# Patient Record
Sex: Male | Born: 1955 | Race: Black or African American | Hispanic: No | Marital: Married | Smoking: Current every day smoker
Health system: Southern US, Community
[De-identification: ages and names within clinical notes are randomized; demographics above are authoritative.]

## PROBLEM LIST (undated history)

## (undated) DIAGNOSIS — I10 Essential (primary) hypertension: Secondary | ICD-10-CM

## (undated) DIAGNOSIS — E119 Type 2 diabetes mellitus without complications: Secondary | ICD-10-CM

## (undated) DIAGNOSIS — K219 Gastro-esophageal reflux disease without esophagitis: Secondary | ICD-10-CM

## (undated) DIAGNOSIS — N289 Disorder of kidney and ureter, unspecified: Secondary | ICD-10-CM

---

## 2001-08-11 ENCOUNTER — Encounter: Payer: Self-pay | Admitting: Emergency Medicine

## 2001-08-11 ENCOUNTER — Emergency Department (HOSPITAL_COMMUNITY): Admission: EM | Admit: 2001-08-11 | Discharge: 2001-08-11 | Payer: Self-pay | Admitting: Emergency Medicine

## 2001-09-15 ENCOUNTER — Emergency Department (HOSPITAL_COMMUNITY): Admission: EM | Admit: 2001-09-15 | Discharge: 2001-09-15 | Payer: Self-pay | Admitting: Emergency Medicine

## 2001-09-15 ENCOUNTER — Encounter: Payer: Self-pay | Admitting: Emergency Medicine

## 2001-10-23 ENCOUNTER — Emergency Department (HOSPITAL_COMMUNITY): Admission: EM | Admit: 2001-10-23 | Discharge: 2001-10-24 | Payer: Self-pay | Admitting: Emergency Medicine

## 2001-11-14 ENCOUNTER — Ambulatory Visit (HOSPITAL_COMMUNITY): Admission: RE | Admit: 2001-11-14 | Discharge: 2001-11-14 | Payer: Self-pay | Admitting: Gastroenterology

## 2003-12-05 ENCOUNTER — Emergency Department (HOSPITAL_COMMUNITY): Admission: EM | Admit: 2003-12-05 | Discharge: 2003-12-05 | Payer: Self-pay | Admitting: Emergency Medicine

## 2004-05-26 ENCOUNTER — Emergency Department (HOSPITAL_COMMUNITY): Admission: EM | Admit: 2004-05-26 | Discharge: 2004-05-27 | Payer: Self-pay | Admitting: Emergency Medicine

## 2005-09-21 IMAGING — CT CT PELVIS W/O CM
1 series · 15 of 32 positions shown, 19 images · non-contrast
Comparison: 12/05/03.

CLINICAL DATA: Right-sided abdominal and pelvic pain and flank pain.  Evaluate for urinary calculi or hydronephrosis.
TECHNIQUE: Multidetector CT imaging of the abdomen and pelvis was performed following the renal stone protocol.  No oral or intravenous contrast was administered.

[Series 3: stone_wo 5.0 b40f st · axial · 0.59mm/px · z∈[+1112,+1400]mm · 15 of 81 slices shown, 19 images]
[im 6/81  soft-tissue]
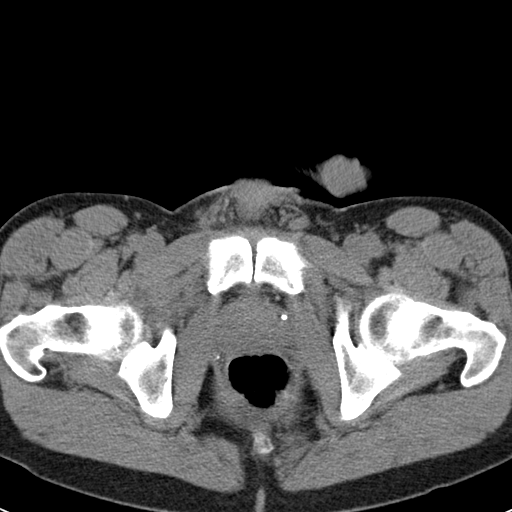
[im 6/81  bone]
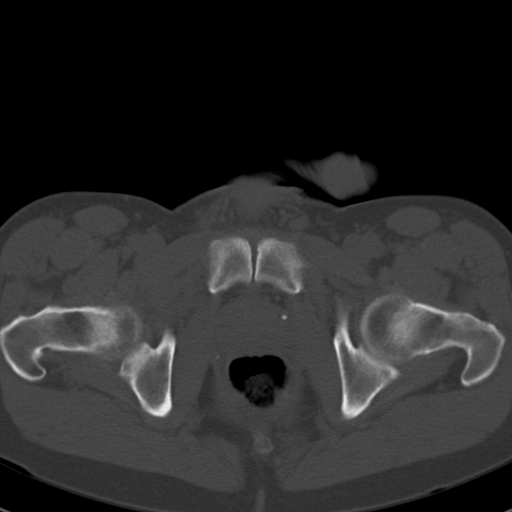
[im 11/81  soft-tissue]
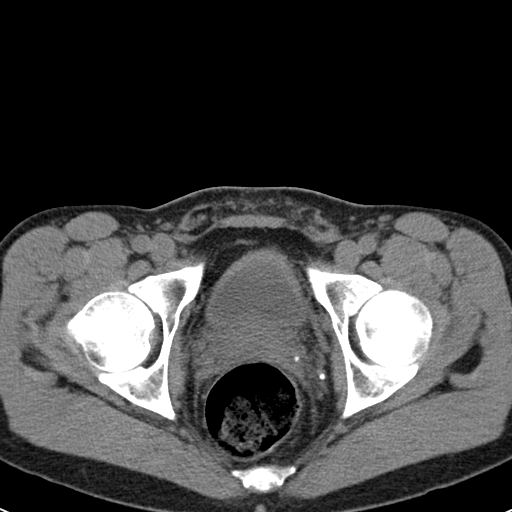
[im 16/81  soft-tissue]
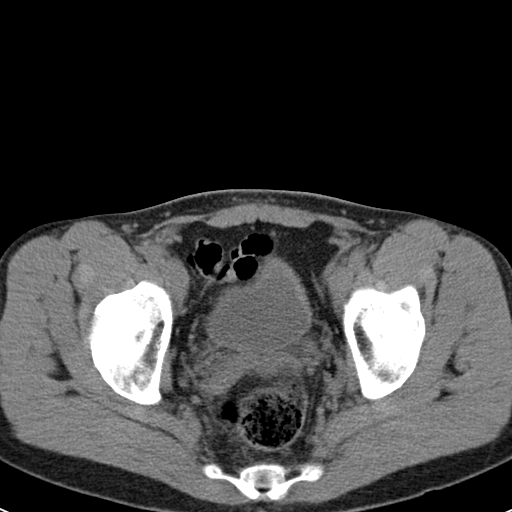
[im 24/81  soft-tissue]
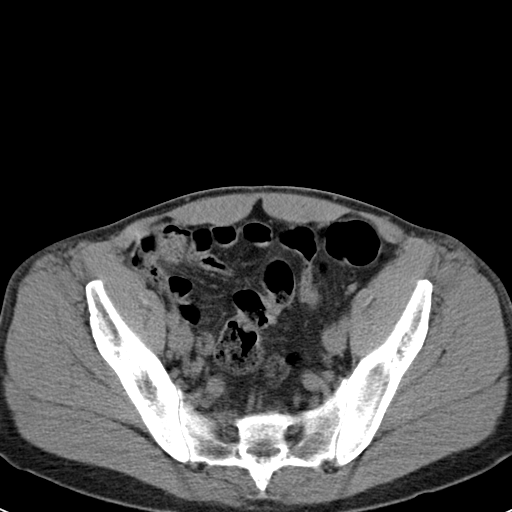
[im 29/81  soft-tissue]
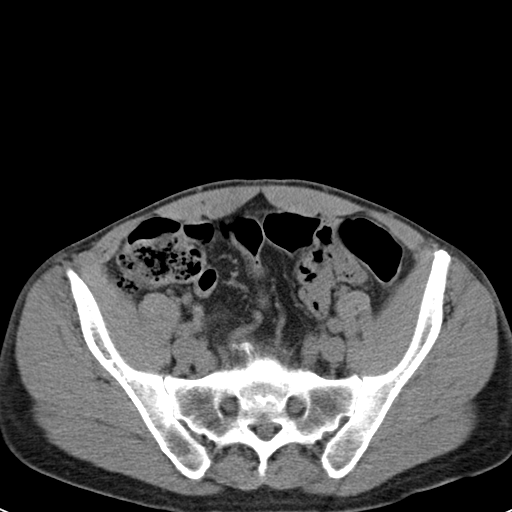
[im 34/81  soft-tissue]
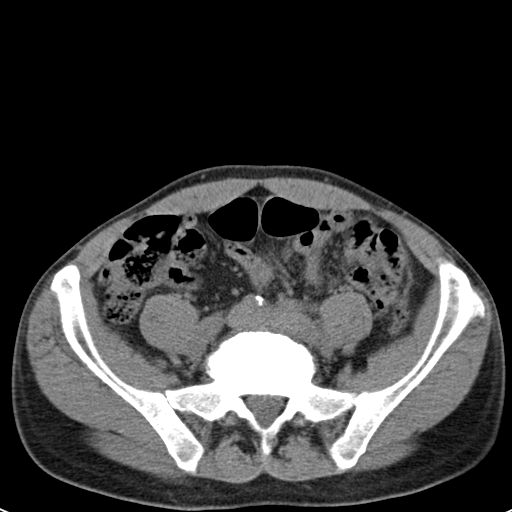
[im 42/81  soft-tissue]
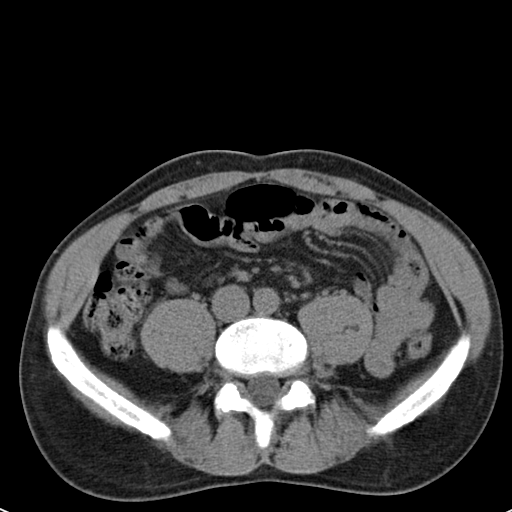
[im 47/81  soft-tissue]
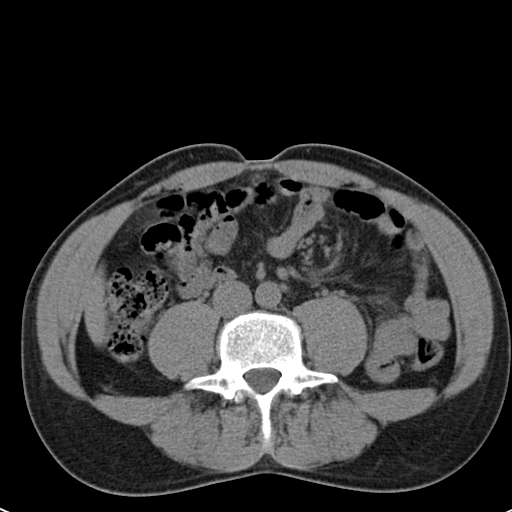
[im 52/81  soft-tissue]
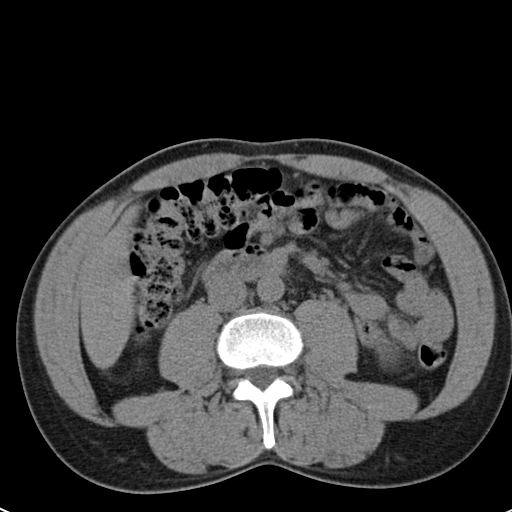
[im 52/81  bone]
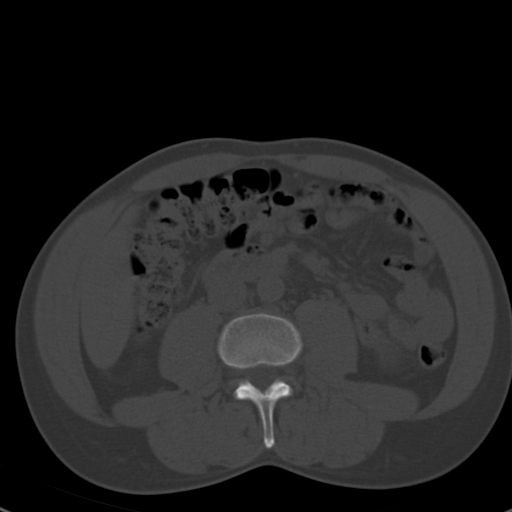
[im 57/81  soft-tissue]
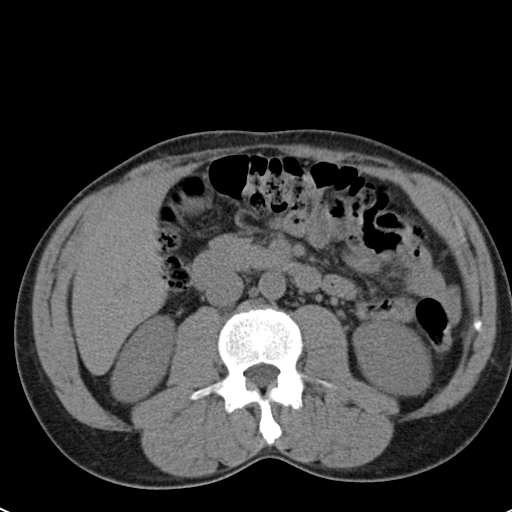
[im 65/81  soft-tissue]
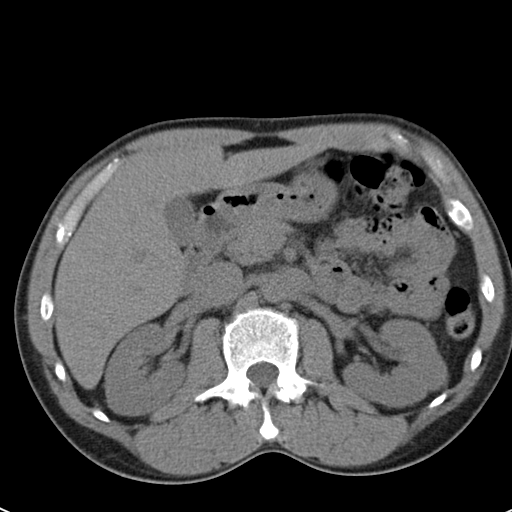
[im 70/81  soft-tissue]
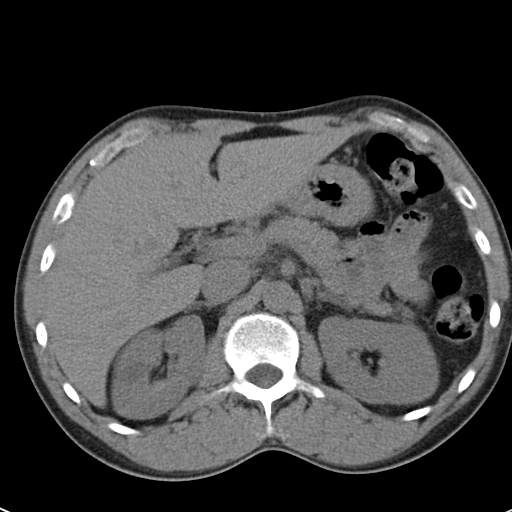
[im 70/81  lung]
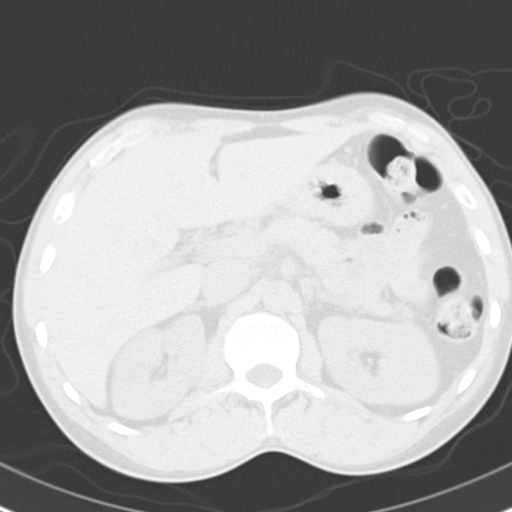
[im 73/81  lung]
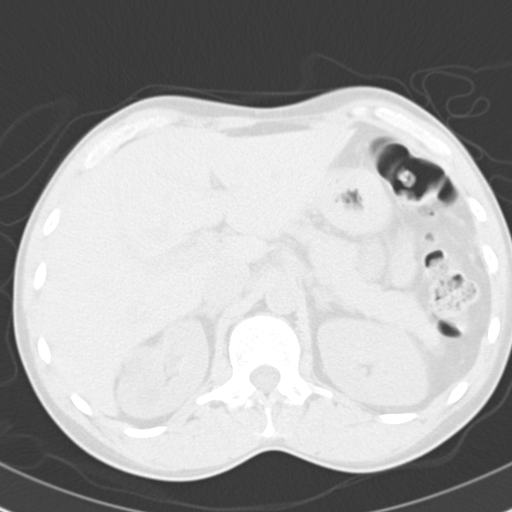
[im 75/81  soft-tissue]
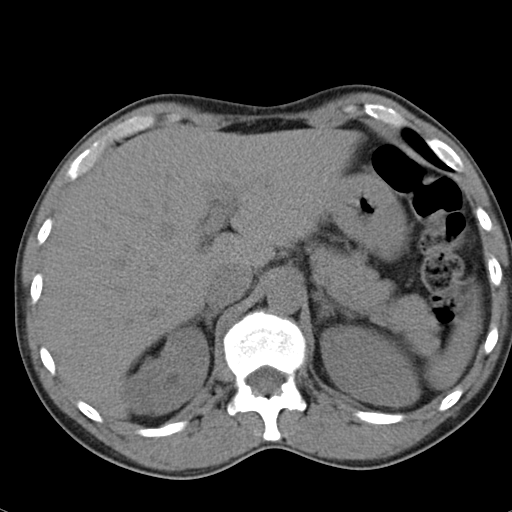
[im 75/81  lung]
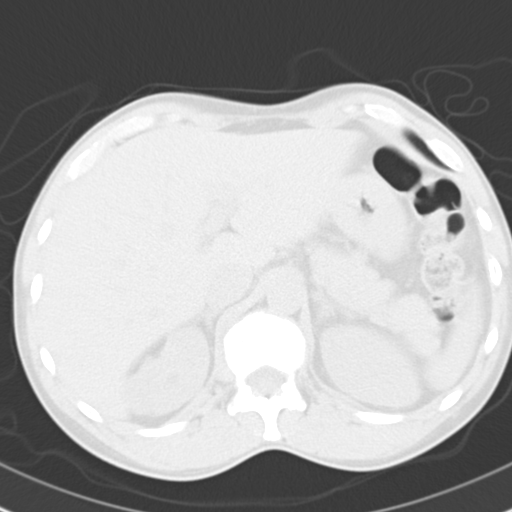
[im 78/81  lung]
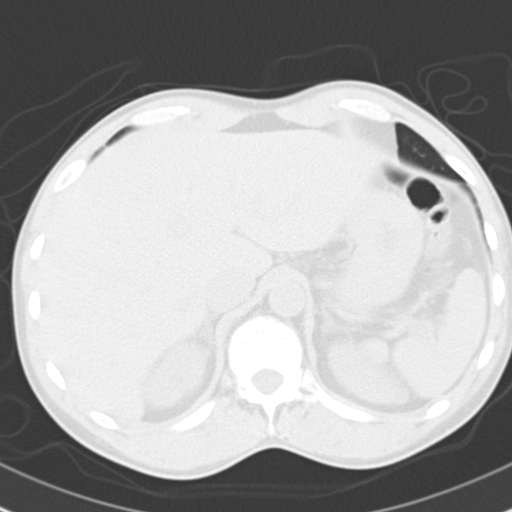

[15 of 32 positions shown; findings below may reference images not displayed]

ABDOMEN CT WITHOUT CONTRAST:
 There is no evidence of renal calculi or hydronephrosis.  Left-sided hydronephrosis has resolved since the previous study.  Small cysts are again noted in the upper pole of the right kidney, as well as mild renal parenchymal scarring. 
 The visualized portions of the other abdominal parenchymal organs are unremarkable.  No masses are identified, and there is no evidence of inflammatory process or abnormal fluid collections.
IMPRESSION: No evidence of renal calculi, hydronephrosis, or other acute findings.  Left hydronephrosis has resolved since prior study.
 PELVIS CT WITHOUT CONTRAST:
 There is no evidence of ureteral calculi or dilatation.  Pelvic phleboliths are stable.  Previously seen left ureteral calculus is no longer visualized. 
 There is no evidence of pelvic masses.  There is no evidence of inflammatory process or abnormal fluid collections.  The unopacified bowel loops are unremarkable in appearance.
IMPRESSION: Negative noncontrast pelvis CT.  No evidence of ureteral calculi or other acute process.

## 2007-05-05 ENCOUNTER — Emergency Department (HOSPITAL_COMMUNITY): Admission: EM | Admit: 2007-05-05 | Discharge: 2007-05-05 | Payer: Self-pay | Admitting: Emergency Medicine

## 2008-08-29 IMAGING — US US ABDOMEN COMPLETE
1 series · 14 of 25 positions shown · non-contrast
Comparison: none

CLINICAL DATA: Back pain.
 ABDOMEN ULTRASOUND COMPLETE ? 05/05/07 AT 5315 HOURS:
TECHNIQUE: Complete abdominal ultrasound examination was performed including evaluation of the liver, gallbladder, bile ducts, pancreas, kidneys, spleen, IVC, and abdominal aorta.

[Series 1: unknown · 0.32mm/px · 14 of 73 slices shown]
[im 1/73]
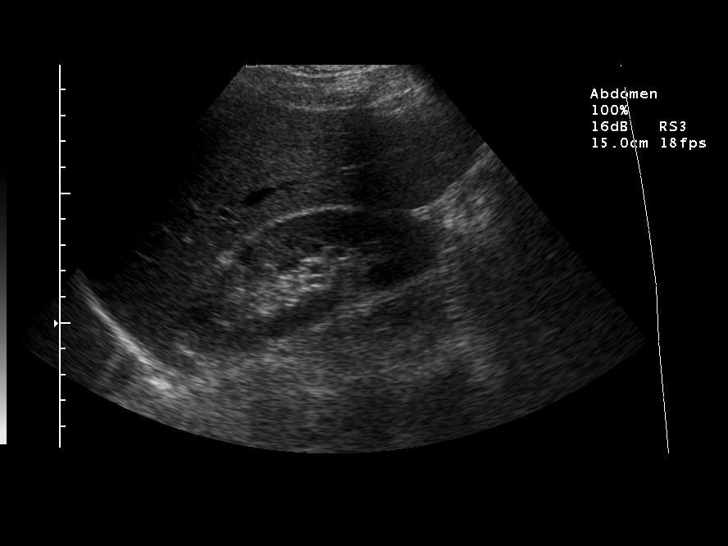
[im 7/73]
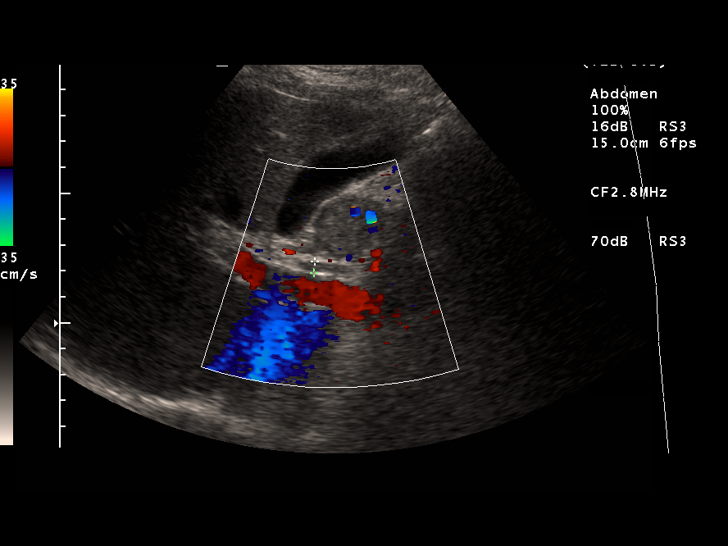
[im 13/73]
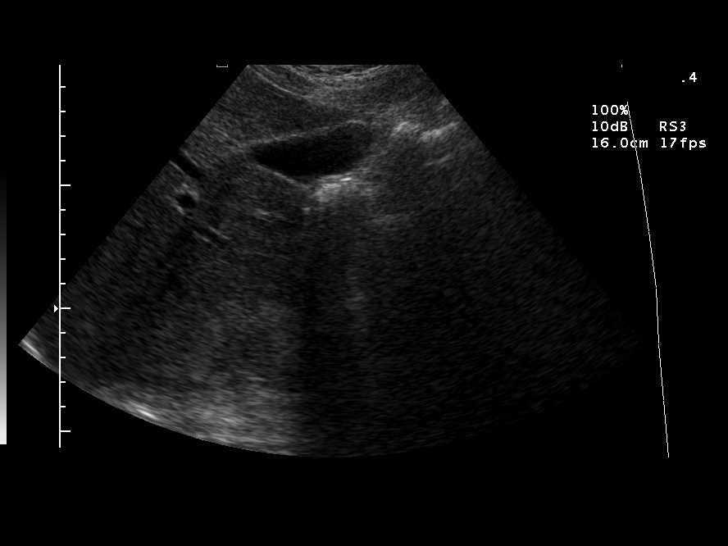
[im 19/73]
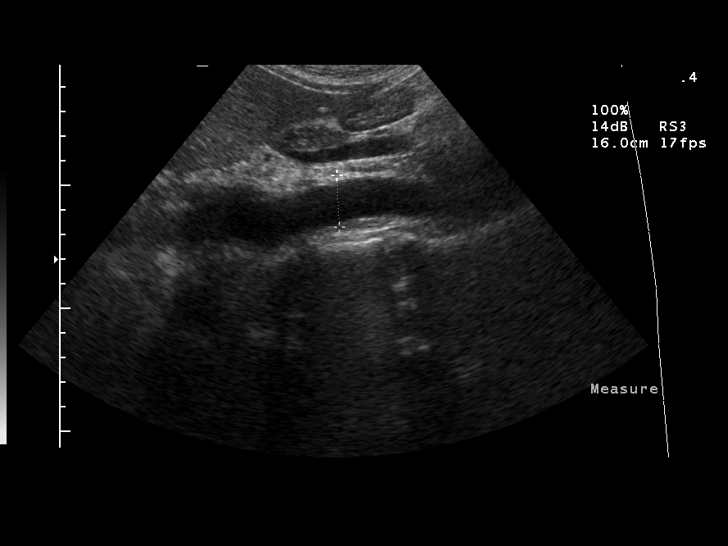
[im 25/73]
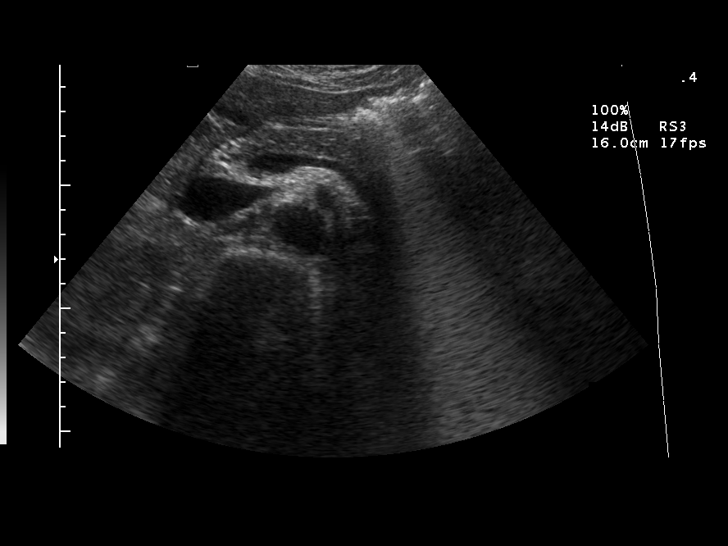
[im 28/73]
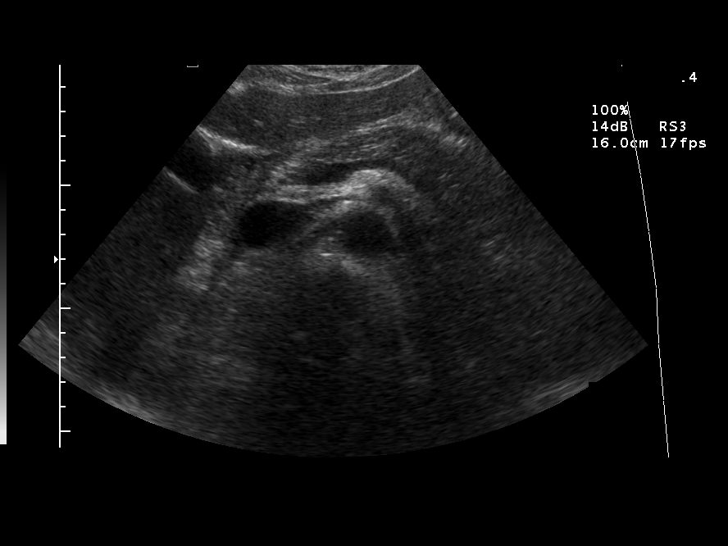
[im 34/73]
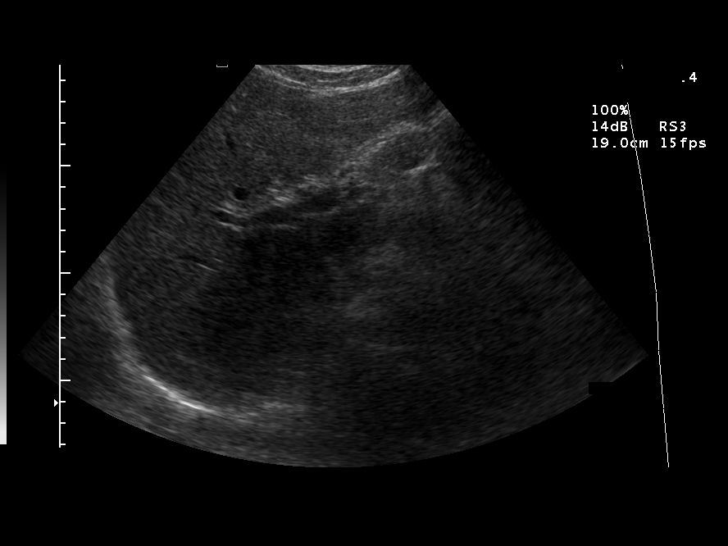
[im 40/73]
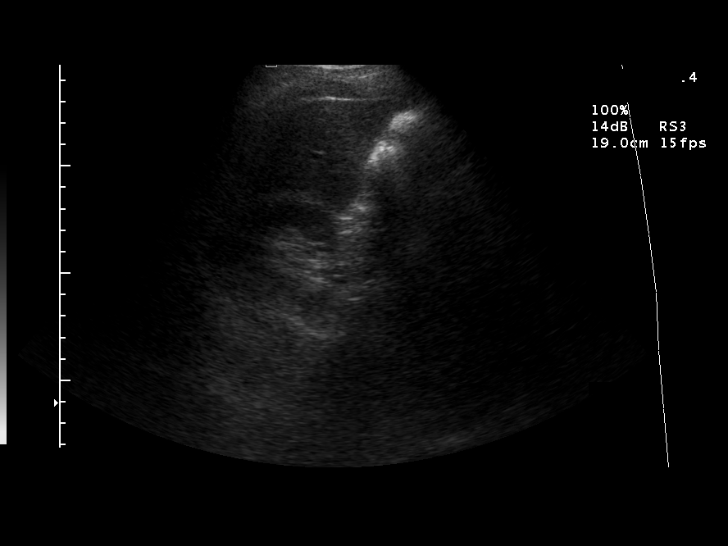
[im 46/73]
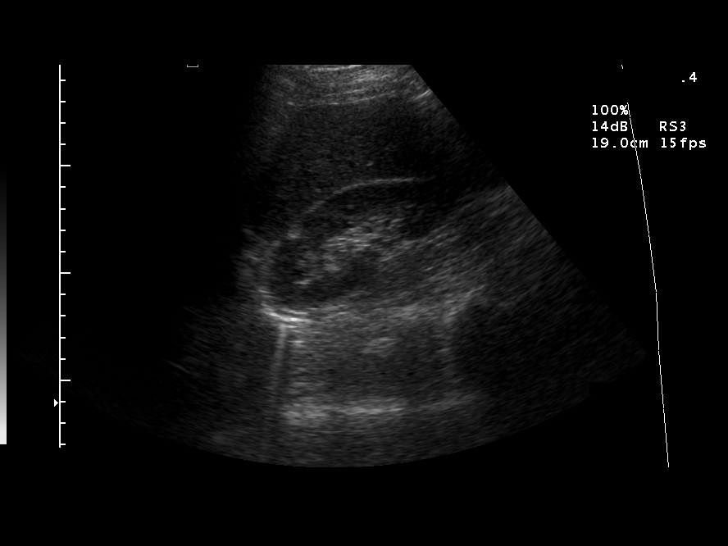
[im 49/73]
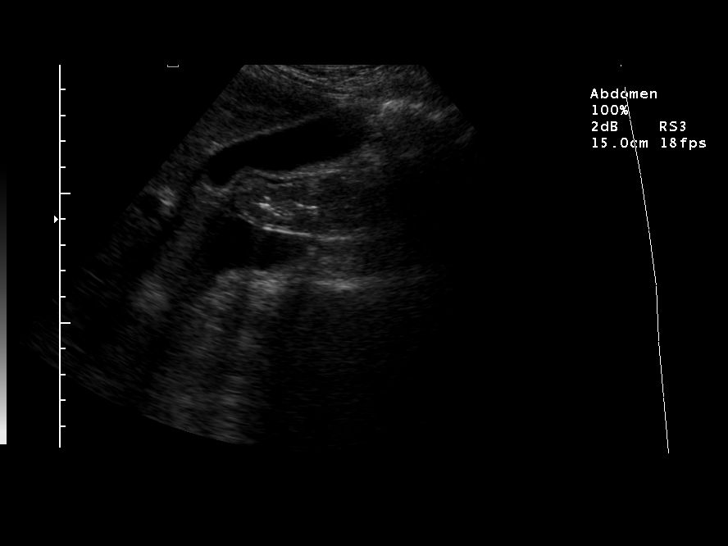
[im 55/73]
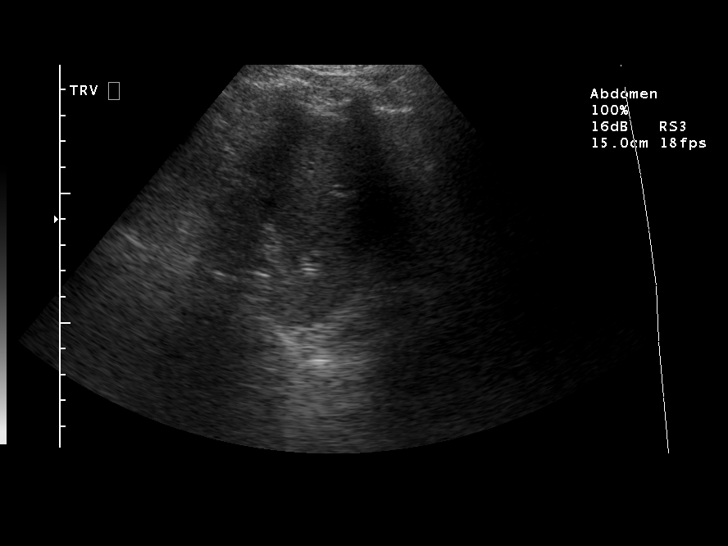
[im 61/73]
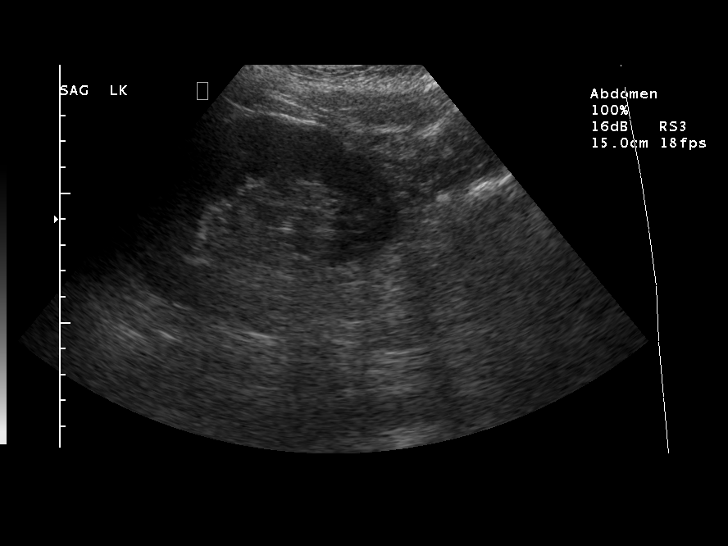
[im 67/73]
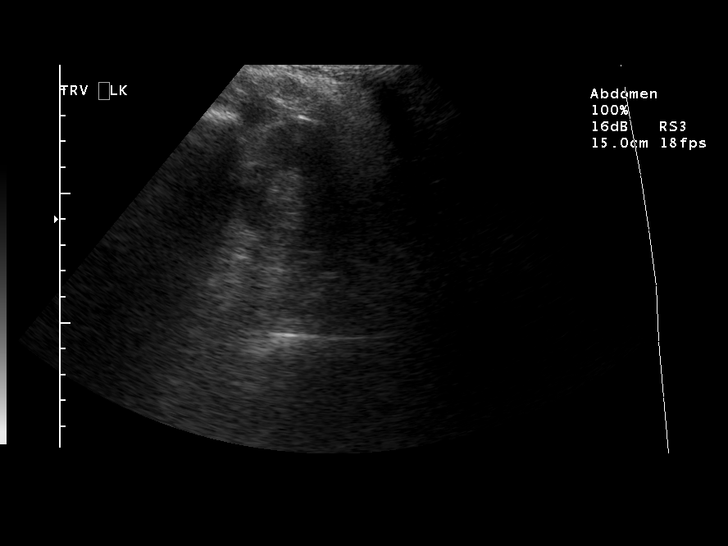
[im 73/73]
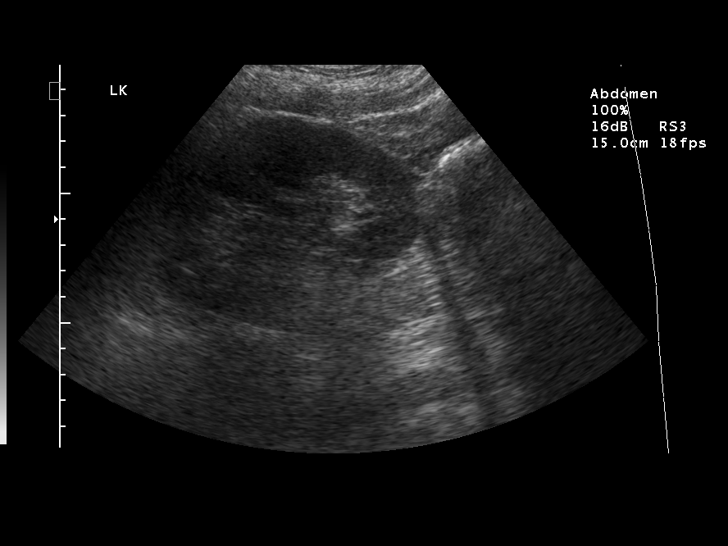

[14 of 25 positions shown; findings below may reference images not displayed]

FINDINGS: There is no evidence of gallstones or gallbladder wall  thickening.  There is no evidence of biliary ductal dilatation.   The liver is within normal limits in echogenicity and no focal parenchymal lesions are identified.  The visualized portion of the pancreas is unremarkable in appearance.
 The kidneys are within normal limits in size and echogenicity.  There is no evidence of masses or hydronephrosis.  There is no evidence of splenomegaly.  The distal aorta was obscured limiting the exam.   Images of the inferior vena cava are unremarkable.  There is no evidence of ascites.
 IMPRESSION
 Limited visualization of the distal aorta.  Otherwise, no acute intra-abdominal pathology.

## 2009-02-21 ENCOUNTER — Emergency Department (HOSPITAL_COMMUNITY): Admission: EM | Admit: 2009-02-21 | Discharge: 2009-02-21 | Payer: Self-pay | Admitting: Emergency Medicine

## 2010-07-23 NOTE — Op Note (Signed)
   NAMEMANDEEP, Carlos Ross                       ACCOUNT NO.:  [id number]   MEDICAL RECORD NO.:  [id number]                   PATIENT TYPE:  AMB   LOCATION:  ENDO                                 FACILITY:  [facility]   PHYSICIAN:  Charna Elizabeth, M.D.                   DATE OF BIRTH:  11-25-1955   DATE OF PROCEDURE:  11/14/2001  DATE OF DISCHARGE:                                 OPERATIVE REPORT   PROCEDURE PERFORMED:  Colonoscopy.   ENDOSCOPIST:  Charna Elizabeth, M.D.   INSTRUMENT USED:  Olympus video colonoscope.   INDICATIONS OF PROCEDURE:  A 55 year old African-American male with a  history of rectal bleeding and family history of colon cancer in his cousin,  and a questionable history of colon cancer in her maternal aunt.   PREPROCEDURE PREPARATION:  Informed consent was procured from the patient.  The patient fasted for eight hours prior to the procedure and prepped with a  bottle of magnesium citrate and a gallon of NuLytely the night prior to the  procedure.   PREPROCEDURE PHYSICAL:  The patient had stable vital signs.  Neck supple.  Chest clear to auscultation.  S1 and S2 regular.  Abdomen soft with normal  bowel sounds.   DESCRIPTION OF PROCEDURE:  The patient was placed in the left lateral  decubitus position and sedated with 100 mg of Demerol and 7.5 mg of Versed  intravenously.  Once the patient was adequately sedated and maintained on  low-flow oxygen and continuous cardiac monitoring, the Olympus video  colonoscope was advanced from the rectum to the cecum without difficulty.  Except for prominent internal hemorrhoids, no other masses were noted.  No  masses, polyps, erosions, ulcerations or diverticula were present.   IMPRESSION:  Prominent internal hemorrhoids, otherwise normal colonoscopy.   RECOMMENDATIONS:  1. Anusol-HC 2.5% suppositories have been prescribed, one p.o. q. h.s., #30,     with three refills.  2. A high-fiber diet has been recommended.  3. Outpatient  followup in the next two weeks for further recommendations.                                               Charna Elizabeth, M.D.    JM/MEDQ  D:  11/14/2001  T:  11/15/2001  Job:  [id number]   cc:   Lonzo Cloud, M.D.  [facility]  [employer]

## 2010-11-26 LAB — HEPATIC FUNCTION PANEL
Albumin: 3.6
Alkaline Phosphatase: 56
Total Bilirubin: 0.5

## 2010-11-26 LAB — I-STAT 8, (EC8 V) (CONVERTED LAB)
Acid-base deficit: 1
Potassium: 4.4
Sodium: 140
TCO2: 26
pH, Ven: 7.354 — ABNORMAL HIGH

## 2010-11-26 LAB — LIPASE, BLOOD: Lipase: 37

## 2010-11-26 LAB — URINALYSIS, ROUTINE W REFLEX MICROSCOPIC
Bilirubin Urine: NEGATIVE
Glucose, UA: NEGATIVE
Hgb urine dipstick: NEGATIVE
Ketones, ur: NEGATIVE
Nitrite: NEGATIVE
pH: 6

## 2015-04-07 ENCOUNTER — Emergency Department (HOSPITAL_COMMUNITY)
Admission: EM | Admit: 2015-04-07 | Discharge: 2015-04-07 | Disposition: A | Discharge status: Home / Self Care | Payer: Self-pay | Attending: Emergency Medicine | Admitting: Emergency Medicine

## 2015-04-07 ENCOUNTER — Encounter (HOSPITAL_COMMUNITY): Payer: Self-pay

## 2015-04-07 DIAGNOSIS — Z87448 Personal history of other diseases of urinary system: Secondary | ICD-10-CM | POA: Insufficient documentation

## 2015-04-07 DIAGNOSIS — Y998 Other external cause status: Secondary | ICD-10-CM | POA: Insufficient documentation

## 2015-04-07 DIAGNOSIS — Y9289 Other specified places as the place of occurrence of the external cause: Secondary | ICD-10-CM | POA: Insufficient documentation

## 2015-04-07 DIAGNOSIS — X58XXXA Exposure to other specified factors, initial encounter: Secondary | ICD-10-CM | POA: Insufficient documentation

## 2015-04-07 DIAGNOSIS — T782XXA Anaphylactic shock, unspecified, initial encounter: Secondary | ICD-10-CM

## 2015-04-07 DIAGNOSIS — E119 Type 2 diabetes mellitus without complications: Secondary | ICD-10-CM | POA: Insufficient documentation

## 2015-04-07 DIAGNOSIS — Z7984 Long term (current) use of oral hypoglycemic drugs: Secondary | ICD-10-CM | POA: Insufficient documentation

## 2015-04-07 DIAGNOSIS — Z8719 Personal history of other diseases of the digestive system: Secondary | ICD-10-CM | POA: Insufficient documentation

## 2015-04-07 DIAGNOSIS — T7807XA Anaphylactic reaction due to milk and dairy products, initial encounter: Secondary | ICD-10-CM | POA: Insufficient documentation

## 2015-04-07 DIAGNOSIS — Y9389 Activity, other specified: Secondary | ICD-10-CM | POA: Insufficient documentation

## 2015-04-07 DIAGNOSIS — F17200 Nicotine dependence, unspecified, uncomplicated: Secondary | ICD-10-CM | POA: Insufficient documentation

## 2015-04-07 HISTORY — DX: Type 2 diabetes mellitus without complications: E11.9

## 2015-04-07 HISTORY — DX: Gastro-esophageal reflux disease without esophagitis: K21.9

## 2015-04-07 HISTORY — DX: Disorder of kidney and ureter, unspecified: N28.9

## 2015-04-07 LAB — CBC WITH DIFFERENTIAL/PLATELET
Basophils Absolute: 0 10*3/uL (ref 0.0–0.1)
Basophils Relative: 0 %
EOS PCT: 2 %
Eosinophils Absolute: 0.1 10*3/uL (ref 0.0–0.7)
HCT: 35.2 % — ABNORMAL LOW (ref 39.0–52.0)
Hemoglobin: 11.5 g/dL — ABNORMAL LOW (ref 13.0–17.0)
LYMPHS ABS: 1.5 10*3/uL (ref 0.7–4.0)
LYMPHS PCT: 25 %
MCH: 27.7 pg (ref 26.0–34.0)
MCHC: 32.7 g/dL (ref 30.0–36.0)
MCV: 84.8 fL (ref 78.0–100.0)
MONO ABS: 0.5 10*3/uL (ref 0.1–1.0)
MONOS PCT: 8 %
Neutro Abs: 4.1 10*3/uL (ref 1.7–7.7)
Neutrophils Relative %: 65 %
PLATELETS: 217 10*3/uL (ref 150–400)
RBC: 4.15 MIL/uL — ABNORMAL LOW (ref 4.22–5.81)
RDW: 13 % (ref 11.5–15.5)
WBC: 6.3 10*3/uL (ref 4.0–10.5)

## 2015-04-07 LAB — BASIC METABOLIC PANEL
Anion gap: 6 (ref 5–15)
BUN: 13 mg/dL (ref 6–20)
CALCIUM: 8.5 mg/dL — AB (ref 8.9–10.3)
CO2: 24 mmol/L (ref 22–32)
CREATININE: 0.8 mg/dL (ref 0.61–1.24)
Chloride: 113 mmol/L — ABNORMAL HIGH (ref 101–111)
GFR calc Af Amer: >60 mL/min (ref >60)
GLUCOSE: 88 mg/dL (ref 65–99)
POTASSIUM: 4 mmol/L (ref 3.5–5.1)
Sodium: 143 mmol/L (ref 135–145)

## 2015-04-07 LAB — TROPONIN I: Troponin I: <0.03 ng/mL (ref <0.031)

## 2015-04-07 LAB — CBG MONITORING, ED: Glucose-Capillary: 154 mg/dL — ABNORMAL HIGH (ref 65–99)

## 2015-04-07 MED ORDER — DIPHENHYDRAMINE HCL 25 MG PO TABS
50.0000 mg | ORAL_TABLET | ORAL | Status: AC | PRN
Start: 2015-04-07 — End: ?

## 2015-04-07 MED ORDER — PREDNISONE 50 MG PO TABS: ORAL_TABLET | ORAL | Status: DC

## 2015-04-07 MED ORDER — EPINEPHRINE 0.3 MG/0.3ML IJ SOAJ
0.3000 mg | Freq: Once | INTRAMUSCULAR | Status: DC
  Filled 2015-04-07: qty 0.3

## 2015-04-07 MED ORDER — EPINEPHRINE 0.3 MG/0.3ML IJ SOAJ: 0.3000 mg | Freq: Once | INTRAMUSCULAR | Status: AC | PRN

## 2015-04-07 MED ORDER — SODIUM CHLORIDE 0.9 % IV BOLUS (SEPSIS)
500.0000 mL | Freq: Once | INTRAVENOUS | Status: AC
  Administered 2015-04-07: 500 mL via INTRAVENOUS

## 2015-04-07 MED ORDER — METHYLPREDNISOLONE SODIUM SUCC 125 MG IJ SOLR
125.0000 mg | Freq: Once | INTRAMUSCULAR | Status: AC
  Administered 2015-04-07: 125 mg via INTRAVENOUS
  Filled 2015-04-07: qty 2
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
## 2015-04-07 NOTE — ED Notes (Signed)
MD at bedside. 
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
## 2015-04-07 NOTE — Progress Notes (Signed)
EDCM consulted to assist with the cost of EPI pen.  Patient listed without insurance.  Patient reports he has seen Dr. Deneen Harts two months ago.  [facility] provided patient with patient assistance form from [employer] to assist in cost of the EPI pen.  EDCM instructed patient to make appointment with his pcp, bring patient assistance form with him and have doctor's office fax to [employer].  Patient thankful for assistance.  No further EDCM needs at this time.

## 2015-04-07 NOTE — ED Notes (Signed)
Bed: ZO10 Expected date:  Expected time:  Means of arrival:  Comments: EMS- allergic reaction/meds given en route

## 2015-04-07 NOTE — ED Notes (Signed)
ED PA at bedside

## 2015-04-07 NOTE — Discharge Instructions (Signed)
If you develop any wheezing or shortness of breath administered her EpiPen immediately as instructed. Call 911 immediately he will need to present for evaluation at any time after you give yourself your EpiPen.  Please follow with your primary care doctor in the next 2 days for a check-up. They must obtain records for further management.   Do not hesitate to return to the Emergency Department for any new, worsening or concerning symptoms.    Anaphylactic Reaction An anaphylactic reaction is a sudden, severe allergic reaction that involves the whole body. It can be life threatening. A hospital stay is often required. People with asthma, eczema, or hay fever are slightly more likely to have an anaphylactic reaction. CAUSES  An anaphylactic reaction may be caused by anything to which you are allergic. After being exposed to the allergic substance, your immune system becomes sensitized to it. When you are exposed to that allergic substance again, an allergic reaction can occur. Common causes of an anaphylactic reaction include:  Medicines.  Foods, especially peanuts, wheat, shellfish, milk, and eggs.  Insect bites or stings.  Blood products.  Chemicals, such as dyes, latex, and contrast material used for imaging tests. SYMPTOMS  When an allergic reaction occurs, the body releases histamine and other substances. These substances cause symptoms such as tightening of the airway. Symptoms often develop within seconds or minutes of exposure. Symptoms may include:  Skin rash or hives.  Itching.  Chest tightness.  Swelling of the eyes, tongue, or lips.  Trouble breathing or swallowing.  Lightheadedness or fainting.  Anxiety or confusion.  Stomach pains, vomiting, or diarrhea.  Nasal congestion.  A fast or irregular heartbeat (palpitations). DIAGNOSIS  Diagnosis is based on your history of recent exposure to allergic substances, your symptoms, and a physical exam. Your caregiver may  also perform blood or urine tests to confirm the diagnosis. TREATMENT  Epinephrine medicine is the main treatment for an anaphylactic reaction. Other medicines that may be used for treatment include antihistamines, steroids, and albuterol. In severe cases, fluids and medicine to support blood pressure may be given through an intravenous line (IV). Even if you improve after treatment, you need to be observed to make sure your condition does not get worse. This may require a stay in the hospital. [place] a medical alert bracelet or necklace stating your allergy.  You and your family must learn how to use an anaphylaxis kit or give an epinephrine injection to temporarily treat an emergency allergic reaction. Always carry your epinephrine injection or anaphylaxis kit with you. This can be lifesaving if you have a severe reaction.  Do not drive or perform tasks after treatment until the medicines used to treat your reaction have worn off, or until your caregiver says it is okay.  If you have hives or a rash:  Take medicines as directed by your caregiver.  You may use an over-the-counter antihistamine (diphenhydramine) as needed.  Apply cold compresses to the skin or take baths in cool water. Avoid hot baths or showers. SEEK MEDICAL CARE IF:   You develop symptoms of an allergic reaction to a new substance. Symptoms may start right away or minutes later.  You develop a rash, hives, or itching.  You develop new symptoms. SEEK IMMEDIATE MEDICAL CARE IF:   You have swelling of the mouth, difficulty breathing, or wheezing.  You have a tight feeling in your chest or throat.  You develop hives, swelling, or itching all over your body.  You develop severe vomiting or diarrhea.  You feel faint or pass out. This is an emergency. Use your epinephrine injection or anaphylaxis kit as you have been instructed. Call your local emergency services (911 in [place] Even if you  improve after the injection, you need to be examined at a hospital emergency department. MAKE SURE YOU:   Understand these instructions.  Will watch your condition.  Will get help right away if you are not doing well or get worse.   This information is not intended to replace advice given to you by your health care provider. Make sure you discuss any questions you have with your health care provider.   Document Released: 02/21/2005 Document Revised: 02/26/2013 Document Reviewed: 09/03/2014 Elsevier Interactive Patient Education [employer].

## 2018-12-15 ENCOUNTER — Emergency Department (HOSPITAL_BASED_OUTPATIENT_CLINIC_OR_DEPARTMENT_OTHER)
Admission: EM | Admit: 2018-12-15 | Discharge: 2018-12-15 | Disposition: A | Discharge status: Home / Self Care | Payer: Self-pay | Attending: Emergency Medicine | Admitting: Emergency Medicine

## 2018-12-15 ENCOUNTER — Other Ambulatory Visit: Payer: Self-pay

## 2018-12-15 ENCOUNTER — Emergency Department (HOSPITAL_BASED_OUTPATIENT_CLINIC_OR_DEPARTMENT_OTHER): Payer: Self-pay

## 2018-12-15 ENCOUNTER — Encounter (HOSPITAL_BASED_OUTPATIENT_CLINIC_OR_DEPARTMENT_OTHER): Payer: Self-pay | Admitting: Emergency Medicine

## 2018-12-15 DIAGNOSIS — I10 Essential (primary) hypertension: Secondary | ICD-10-CM | POA: Insufficient documentation

## 2018-12-15 DIAGNOSIS — Z20828 Contact with and (suspected) exposure to other viral communicable diseases: Secondary | ICD-10-CM | POA: Insufficient documentation

## 2018-12-15 DIAGNOSIS — R05 Cough: Secondary | ICD-10-CM | POA: Insufficient documentation

## 2018-12-15 DIAGNOSIS — F17210 Nicotine dependence, cigarettes, uncomplicated: Secondary | ICD-10-CM | POA: Insufficient documentation

## 2018-12-15 DIAGNOSIS — R059 Cough, unspecified: Secondary | ICD-10-CM

## 2018-12-15 HISTORY — DX: Essential (primary) hypertension: I10

## 2018-12-15 MED ORDER — ALBUTEROL SULFATE HFA 108 (90 BASE) MCG/ACT IN AERS: 1.0000 | INHALATION_SPRAY | Freq: Four times a day (QID) | RESPIRATORY_TRACT | 0 refills | Status: AC | PRN

## 2018-12-15 MED ORDER — PREDNISONE 50 MG PO TABS: 50.0000 mg | ORAL_TABLET | Freq: Every day | ORAL | 0 refills | Status: AC

## 2018-12-15 MED ORDER — ALBUTEROL SULFATE (2.5 MG/3ML) 0.083% IN NEBU: 5.0000 mg | INHALATION_SOLUTION | Freq: Once | RESPIRATORY_TRACT | Status: DC

## 2018-12-15 MED ORDER — ALBUTEROL SULFATE HFA 108 (90 BASE) MCG/ACT IN AERS
6.0000 | INHALATION_SPRAY | Freq: Once | RESPIRATORY_TRACT | Status: AC
  Administered 2018-12-15: 8 via RESPIRATORY_TRACT
  Filled 2018-12-15: qty 6.7

## 2018-12-18 ENCOUNTER — Telehealth: Payer: Self-pay | Admitting: General Practice

## 2018-12-18 NOTE — Telephone Encounter (Signed)
Negative COVID results given. Patient results "NOT Detected." Caller expressed understanding. ° °

## 2019-05-31 ENCOUNTER — Ambulatory Visit: Payer: Self-pay | Attending: Internal Medicine

## 2019-05-31 DIAGNOSIS — Z23 Encounter for immunization: Secondary | ICD-10-CM

## 2019-06-24 ENCOUNTER — Ambulatory Visit: Payer: Self-pay | Attending: Internal Medicine

## 2019-06-24 DIAGNOSIS — Z23 Encounter for immunization: Secondary | ICD-10-CM

## 2022-11-18 ENCOUNTER — Other Ambulatory Visit: Payer: Self-pay

## 2022-11-18 ENCOUNTER — Emergency Department (HOSPITAL_COMMUNITY)
Admission: EM | Admit: 2022-11-18 | Discharge: 2022-11-18 | Disposition: A | Discharge status: Home / Self Care | Payer: Medicare HMO | Attending: Emergency Medicine | Admitting: Emergency Medicine

## 2022-11-18 ENCOUNTER — Encounter (HOSPITAL_COMMUNITY): Payer: Self-pay

## 2022-11-18 DIAGNOSIS — I10 Essential (primary) hypertension: Secondary | ICD-10-CM | POA: Insufficient documentation

## 2022-11-18 DIAGNOSIS — B029 Zoster without complications: Secondary | ICD-10-CM | POA: Insufficient documentation

## 2022-11-18 DIAGNOSIS — Z79899 Other long term (current) drug therapy: Secondary | ICD-10-CM | POA: Insufficient documentation

## 2022-11-18 DIAGNOSIS — R21 Rash and other nonspecific skin eruption: Secondary | ICD-10-CM | POA: Insufficient documentation

## 2022-11-18 MED ORDER — VALACYCLOVIR HCL 1 G PO TABS: 1000.0000 mg | ORAL_TABLET | Freq: Three times a day (TID) | ORAL | 0 refills | Status: DC

## 2022-11-18 MED ORDER — GABAPENTIN 300 MG PO CAPS: 300.0000 mg | ORAL_CAPSULE | Freq: Two times a day (BID) | ORAL | 0 refills | Status: DC | PRN

## 2022-11-18 NOTE — Discharge Instructions (Addendum)
You have shingles.  I recommend you take Valtrex 1000 mg 3 times daily for a week.  You can take Tylenol or Motrin for pain.  I have also prescribed gabapentin as needed for severe pain  You need to follow-up with your doctor  Return to ER if you have worse rash or severe pain or fever

## 2022-11-18 NOTE — ED Provider Notes (Signed)
EMERGENCY DEPARTMENT AT [facility] Provider Note   CSN: [id number] Arrival date & time: 11/18/22  1821     History  Chief Complaint  Patient presents with   Rash    Carlos Ross is a 67 y.o. male history of hypertension, here presenting with rash.  Patient states that he noticed a rash in the left flank area for the last 2 to 3 days.  He states that there is tender when he touches it.  He went to primary care doctor's office for his annual physical but did not mention it.  His wife noticed the rash afterwards.  He states that he had chickenpox as a kid.  He denies any fevers or chills.  The history is provided by the patient.       Home Medications Prior to Admission medications   Medication Sig Start Date End Date Taking? Authorizing Provider  gabapentin (NEURONTIN) 300 MG capsule Take 1 capsule (300 mg total) by mouth 2 (two) times daily as needed (neuropathic pain). 11/18/22  Yes Charlynne Pander, MD  valACYclovir (VALTREX) 1000 MG tablet Take 1 tablet (1,000 mg total) by mouth 3 (three) times daily. 11/18/22  Yes Charlynne Pander, MD  alfuzosin (UROXATRAL) 10 MG 24 hr tablet Take 10 mg by mouth daily with breakfast.    [provider]  diphenhydrAMINE (BENADRYL) 25 MG tablet Take 2 tablets (50 mg total) by mouth every 4 (four) hours as needed for itching. 04/07/15   Pisciotta, Joni Reining, PA-C  EPINEPHrine (EPIPEN 2-PAK) 0.3 mg/0.3 mL IJ SOAJ injection Inject 0.3 mLs (0.3 mg total) into the muscle once as needed (for severe allergic reaction). CAll 911 immediately if you have to use this medicine 04/07/15   Pisciotta, Joni Reining, PA-C  fluticasone [facility]) 50 MCG/ACT nasal spray Place 2 sprays into both nostrils daily.    [provider]  metFORMIN (GLUCOPHAGE) 500 MG tablet Take 500 mg by mouth 2 (two) times daily with a meal.    [provider]  naproxen (NAPROSYN) 500 MG tablet Take 500 mg by mouth 2 (two) times daily as needed  for moderate pain.    [provider]  predniSONE (DELTASONE) 50 MG tablet Take 1 tablet daily with breakfast 04/07/15   Pisciotta, Joni Reining, PA-C  Tolterodine Tartrate (DETROL PO) Take 1 tablet by mouth daily.    [provider]      Allergies    Nexium [esomeprazole magnesium]    Review of Systems   Review of Systems  Skin:  Positive for rash.  All other systems reviewed and are negative.   Physical Exam Updated Vital Signs BP 115/64 (BP Location: Left Arm)   Pulse 71   Temp 97.9 F (36.6 C) (Oral)   Resp 16   Ht 5\' 8"  (1.727 m)   Wt 57.6 kg   SpO2 100%   BMI 19.31 kg/m  Physical Exam Vitals and nursing note reviewed.  Constitutional:      Appearance: Normal appearance.  HENT:     Head: Normocephalic.     Nose: Nose normal.  Eyes:     Extraocular Movements: Extraocular movements intact.     Pupils: Pupils are equal, round, and reactive to light.  Cardiovascular:     Rate and Rhythm: Normal rate and regular rhythm.     Pulses: Normal pulses.     Heart sounds: Normal heart sounds.  Pulmonary:     Effort: Pulmonary effort is normal.     Breath sounds: Normal  breath sounds.  Abdominal:     General: Abdomen is flat.     Palpations: Abdomen is soft.  Musculoskeletal:        General: Normal range of motion.     Cervical back: Normal range of motion and neck supple.  Skin:    Comments: Patient has obvious shingles rash on the T10 dermatome.  There is already blistering.  There is no obvious superimposed cellulitis.  Neurological:     General: No focal deficit present.     Mental Status: He is alert and oriented to person, place, and time.  Psychiatric:        Mood and Affect: Mood normal.        Behavior: Behavior normal.     ED Results / Procedures / Treatments   Labs (all labs ordered are listed, but only abnormal results are displayed) Labs Reviewed - No data to display  EKG None  Radiology No results found.  Procedures Procedures     Medications Ordered in ED Medications - No data to display  ED Course/ Medical Decision Making/ A&P                                 Medical Decision Making Carlos Ross is a 67 y.o. male here presenting with rash.  Patient had rash in the left flank area for the last several days.  Patient has obvious shingles on exam.  Patient has no obvious superimposed cellulitis.  Will discharge home with Valtrex.  Patient does not appear to be in pain so I encouraged him to take Tylenol or Motrin and prescribed gabapentin as needed.   Problems Addressed: Herpes zoster without complication: acute illness or injury  Risk Prescription drug management.    Final Clinical Impression(s) / ED Diagnoses Final diagnoses:  Herpes zoster without complication    Rx / DC Orders ED Discharge Orders          Ordered    valACYclovir (VALTREX) 1000 MG tablet  3 times daily        11/18/22 1910    gabapentin (NEURONTIN) 300 MG capsule  2 times daily PRN        11/18/22 1910              Charlynne Pander, MD 11/18/22 1919

## 2022-11-18 NOTE — ED Triage Notes (Signed)
Red raised rash to lower torso. Pt states it has been there for about a week. Saw PCP today, but did not show them the rash. Denies fever, chills, body aches.

## 2022-11-21 ENCOUNTER — Encounter (HOSPITAL_COMMUNITY): Payer: Self-pay

## 2023-01-09 ENCOUNTER — Emergency Department (HOSPITAL_COMMUNITY)
Admission: EM | Admit: 2023-01-09 | Discharge: 2023-01-09 | Disposition: A | Discharge status: Home / Self Care | Payer: Medicare HMO | Attending: Emergency Medicine | Admitting: Emergency Medicine

## 2023-01-09 ENCOUNTER — Emergency Department (HOSPITAL_COMMUNITY): Payer: Medicare HMO

## 2023-01-09 ENCOUNTER — Encounter (HOSPITAL_COMMUNITY): Payer: Self-pay

## 2023-01-09 DIAGNOSIS — R1084 Generalized abdominal pain: Secondary | ICD-10-CM | POA: Insufficient documentation

## 2023-01-09 DIAGNOSIS — R109 Unspecified abdominal pain: Secondary | ICD-10-CM | POA: Diagnosis present

## 2023-01-09 LAB — COMPREHENSIVE METABOLIC PANEL
ALT: 25 U/L (ref 0–44)
AST: 23 U/L (ref 15–41)
Albumin: 4.2 g/dL (ref 3.5–5.0)
Alkaline Phosphatase: 56 U/L (ref 38–126)
Anion gap: 9 (ref 5–15)
BUN: 24 mg/dL — ABNORMAL HIGH (ref 8–23)
CO2: 25 mmol/L (ref 22–32)
Calcium: 9.3 mg/dL (ref 8.9–10.3)
Chloride: 106 mmol/L (ref 98–111)
Creatinine, Ser: 1.04 mg/dL (ref 0.61–1.24)
GFR, Estimated: >60 mL/min (ref >60)
Glucose, Bld: 97 mg/dL (ref 70–99)
Potassium: 4.2 mmol/L (ref 3.5–5.1)
Sodium: 140 mmol/L (ref 135–145)
Total Bilirubin: 0.7 mg/dL (ref <1.2)
Total Protein: 7.5 g/dL (ref 6.5–8.1)

## 2023-01-09 LAB — LIPASE, BLOOD: Lipase: 33 U/L (ref 11–51)

## 2023-01-09 LAB — CBC WITH DIFFERENTIAL/PLATELET
Abs Immature Granulocytes: 0.01 10*3/uL (ref 0.00–0.07)
Basophils Absolute: 0 10*3/uL (ref 0.0–0.1)
Basophils Relative: 1 %
Eosinophils Absolute: 0.2 10*3/uL (ref 0.0–0.5)
Eosinophils Relative: 4 %
HCT: 33.8 % — ABNORMAL LOW (ref 39.0–52.0)
Hemoglobin: 11.3 g/dL — ABNORMAL LOW (ref 13.0–17.0)
Immature Granulocytes: 0 %
Lymphocytes Relative: 35 %
Lymphs Abs: 1.5 10*3/uL (ref 0.7–4.0)
MCH: 30 pg (ref 26.0–34.0)
MCHC: 33.4 g/dL (ref 30.0–36.0)
MCV: 89.7 fL (ref 80.0–100.0)
Monocytes Absolute: 0.4 10*3/uL (ref 0.1–1.0)
Monocytes Relative: 10 %
Neutro Abs: 2.1 10*3/uL (ref 1.7–7.7)
Neutrophils Relative %: 50 %
Platelets: 207 10*3/uL (ref 150–400)
RBC: 3.77 MIL/uL — ABNORMAL LOW (ref 4.22–5.81)
RDW: 12.3 % (ref 11.5–15.5)
WBC: 4.2 10*3/uL (ref 4.0–10.5)
nRBC: 0 % (ref 0.0–0.2)

## 2023-01-09 MED ORDER — DICYCLOMINE HCL 20 MG PO TABS: 20.0000 mg | ORAL_TABLET | Freq: Two times a day (BID) | ORAL | 0 refills | Status: DC

## 2023-01-09 NOTE — Discharge Instructions (Signed)
Please be sure to follow-up with your primary care physician and your gastroenterologist.  Return here for concerning changes in your condition.

## 2023-01-09 NOTE — ED Triage Notes (Signed)
POV LLQ pain, for past couple weeks has been using laxatives. States his bowels move daily and have not been regular for past two weeks.Denies n/v/d

## 2023-01-09 NOTE — ED Provider Notes (Signed)
[place] [facility]GENCY DEPARTMENT AT [facility] Provider Note   CSN: [id number] Arrival date & time: 01/09/23  1311     History  No chief complaint on file.   Carlos Ross is a 67 y.o. male.  HPI Patient presents with his wife who assists with the history. He has a history of gastritis, diagnosed via endoscopy at [facility] within the past 2 weeks.  He has been taking his medications since that time. He notes that despite that he continues to have episodic shooting pains across his abdomen, none currently.  No vomiting.  Patient takes laxative daily, is having bowel movements daily. No fever, other complaints.    Home Medications Prior to Admission medications   Medication Sig Start Date End Date Taking? Authorizing Provider  dicyclomine (BENTYL) 20 MG tablet Take 1 tablet (20 mg total) by mouth 2 (two) times daily for 7 days. 01/09/23 01/16/23 Yes Gerhard Munch, MD  albuterol (VENTOLIN HFA) 108 (90 Base) MCG/ACT inhaler Inhale 1-2 puffs into the lungs every 6 (six) hours as needed for wheezing or shortness of breath. 12/15/18   Lorelee New, PA-C  alfuzosin (UROXATRAL) 10 MG 24 hr tablet Take 10 mg by mouth daily with breakfast.    [provider]  diphenhydrAMINE (BENADRYL) 25 MG tablet Take 2 tablets (50 mg total) by mouth every 4 (four) hours as needed for itching. 04/07/15   Pisciotta, Joni Reining, PA-C  EPINEPHrine (EPIPEN 2-PAK) 0.3 mg/0.3 mL IJ SOAJ injection Inject 0.3 mLs (0.3 mg total) into the muscle once as needed (for severe allergic reaction). CAll 911 immediately if you have to use this medicine 04/07/15   Pisciotta, Joni Reining, PA-C  fluticasone [facility]) 50 MCG/ACT nasal spray Place 2 sprays into both nostrils daily.    [provider]  gabapentin (NEURONTIN) 300 MG capsule Take 1 capsule (300 mg total) by mouth 2 (two) times daily as needed (neuropathic pain). 11/18/22   Charlynne Pander, MD  metFORMIN (GLUCOPHAGE) 500 MG tablet Take  500 mg by mouth 2 (two) times daily with a meal.    [provider]  naproxen (NAPROSYN) 500 MG tablet Take 500 mg by mouth 2 (two) times daily as needed for moderate pain.    [provider]  predniSONE (DELTASONE) 50 MG tablet Take 1 tablet daily with breakfast 04/07/15   Pisciotta, Joni Reining, PA-C  Tolterodine Tartrate (DETROL PO) Take 1 tablet by mouth daily.    [provider]  valACYclovir (VALTREX) 1000 MG tablet Take 1 tablet (1,000 mg total) by mouth 3 (three) times daily. 11/18/22   Charlynne Pander, MD      Allergies    Nexium [esomeprazole magnesium] and Nexium [esomeprazole magnesium]    Review of Systems   Review of Systems  Physical Exam Updated Vital Signs BP 114/64   Pulse 61   Temp 98.1 F (36.7 C) (Oral)   Resp (!) 25   SpO2 100%  Physical Exam Vitals and nursing note reviewed.  Constitutional:      General: He is not in acute distress.    Appearance: He is well-developed.  HENT:     Head: Normocephalic and atraumatic.  Eyes:     Conjunctiva/sclera: Conjunctivae normal.  Cardiovascular:     Rate and Rhythm: Normal rate and regular rhythm.  Pulmonary:     Effort: Pulmonary effort is normal. No respiratory distress.     Breath sounds: No stridor.  Abdominal:     General: There is no distension.  Tenderness: There is no abdominal tenderness. There is no guarding or rebound.  Skin:    General: Skin is warm and dry.  Neurological:     Mental Status: He is alert and oriented to person, place, and time.     ED Results / Procedures / Treatments   Labs (all labs ordered are listed, but only abnormal results are displayed) Labs Reviewed  COMPREHENSIVE METABOLIC PANEL - Abnormal; Notable for the following components:      Result Value   BUN 24 (*)    All other components within normal limits  CBC WITH DIFFERENTIAL/PLATELET - Abnormal; Notable for the following components:   RBC 3.77 (*)    Hemoglobin 11.3 (*)    HCT 33.8 (*)     All other components within normal limits  LIPASE, BLOOD    EKG None  Radiology DG Abd Acute W/Chest  Result Date: 01/09/2023 CLINICAL DATA:  abd pain. EXAM: DG ABDOMEN ACUTE WITH 1 VIEW CHEST COMPARISON:  12/15/2018. FINDINGS: The bowel gas pattern is non-obstructive. No evidence of pneumoperitoneum. Bilateral lung fields are clear. Bilateral costophrenic angles are clear. Normal cardio-mediastinal silhouette. No acute osseous abnormalities. The soft tissues are within normal limits. Surgical changes, devices, tubes and lines: None. IMPRESSION: *Negative abdominal radiographs.  No acute cardiopulmonary disease. Electronically Signed   By: Jules Schick M.D.   On: 01/09/2023 16:42    Procedures Procedures    Medications Ordered in ED Medications - No data to display  ED Course/ Medical Decision Making/ A&P                                 Medical Decision Making Adult male presents periprocedure abdominal pain.  Some suspicion for the patient's gastritis contributing to his ongoing pain as he only recently began his medications.  Other considerations include perforation, pneumomediastinum, less likely intra-abdominal processes as he has a soft, nonperitoneal abdomen, continues to have bowel movements has no vomiting, anorexia or fever. Cardiac 75 sinus normal Pulse ox 100% room air normal   Amount and/or Complexity of Data Reviewed Independent Historian: spouse External Data Reviewed: notes. Labs: ordered. Decision-making details documented in ED Course. Radiology: ordered and independent interpretation performed. Decision-making details documented in ED Course.  Risk Prescription drug management. Decision regarding hospitalization. Diagnosis or treatment significantly limited by social determinants of health.   5:00 PM On repeat exam he is now accompanied by his sister as well.  We reviewed the labs at bedside, x-ray results, no evidence for perforation, he is in no  distress, remains hemodynamically unremarkable, labs consistent with prior.  Patient's pharmacy list is out of date, and adjustments were made to reflect his current medication regimen. Patient will start Bentyl, follow-up with GI and primary care.        Final Clinical Impression(s) / ED Diagnoses Final diagnoses:  Generalized abdominal pain    Rx / DC Orders ED Discharge Orders          Ordered    dicyclomine (BENTYL) 20 MG tablet  2 times daily        01/09/23 1700              Gerhard Munch, MD 01/09/23 1701

## 2023-01-10 NOTE — ED Provider Notes (Signed)
8:45 AM I spoke with the patient's sister who is with him yesterday during his evaluation, and left a voicemail on the patient's home telephone number.  I was checking the patient's status today after his presentation yesterday for abdominal pain.  Sister notes that has been doing generally fine, had some episodic pain, similar to that which brought him to the emergency department.  His healthcare before yesterday was at a different system, and I am unable to ensure that he has had follow-up, has an appointment for follow-up, or has had CT imaging.  I encouraged sister, and the patient to be sure to follow-up with his primary care physician, and have appropriate evaluation as deemed necessary.   Gerhard Munch, MD 01/10/23 [phone]

## 2023-08-21 NOTE — ED Provider Notes (Signed)
 [facility] Emergency Department Emergency Department Provider Note  This document was created using the aid of voice recognition Dragon dictation software  ____________________________________________  Time seen: 1:47 AM  I have reviewed the triage vital signs and the nursing notes.   History   Chief Complaint Allergic Reaction and Hives   HPI  Carlos Ross is a 68 y.o. male who presents with allergic reaction. The patient took an antibiotic which his sister gave him for a toothache. The patient took a dose of Cefaclor at 2 PM. The patient also took a dose of Naproxen (two tablets) at 9:30 PM. Allergic reaction began at 10:30 PM. The patient reports his feet began to itch. The patient then noticed diffuse hives/urticaria to the body. The patient reports allergy to Nexium, sesame seeds, [employer] sauce. The patient took two (50 mg total) Benadryl  tablets at 10:30 PM. No dysphagia. No tongue swelling. No SOB and no wheezing.   Physical Exam   VITAL SIGNS:   ED Triage Vitals [08/21/23 2306]  Temp 97.5 F (36.4 C)  Heart Rate 79  Resp 20  BP 122/71  MAP (mmHg) 88  SpO2 100 %  O2 Device None (Room air)  O2 Flow Rate (L/min)   Weight 52.6 kg (116 lb)    Constitutional: Alert and oriented. Well appearing and in no distress. Eyes: Conjunctivae are normal. ENT      Head: Normocephalic and atraumatic. Multiple dental caries. Periodontal abscess to right 2nd vs 1st mandibular molar tooth with TTP. There is no trismus. Tongue is normal. Normal voice. No stridor and no drooling.       Neck: No stridor. Cardiovascular: Normal rate and rhythm. Heart rate: 69. Blood pressure 129/70. Respiratory: Normal respiratory effort. Breath sounds are normal. Oxygen saturation 100% on room air. Gastrointestinal: Soft and nontender. Musculoskeletal: No deformities noted. Skin: Urticaria to abdomen and bilateral upper extremities.  On repeat examination urticaria has  resolved.     Neurologic: Normal speech and language. No gross focal neurologic deficits are appreciated. Psychiatric: Mood and affect are normal.   EKG   According to my interpretation the ECG shows None  Radiology   All X-rays, CTs, and MRIs interpreted by radiologist and interpretation reviewed by me.  Procedures   Procedure(s) performed: None.  Pertinent labs & imaging results that were available during my care of the patient were reviewed by me and considered in my medical decision making (see chart for details).  Total critical care time was 0 minutes. This was spent providing cardiovascular or respiratory resuscitation in a critically ill patient.    ED Clinical Impression   1. Allergic reaction, initial encounter Active  2. Allergic reaction to nonsteroidal anti-inflammatory drug (NSAID) Active  3. Allergy to cephalosporin Active  4. Urticaria Active    Medical Decision Making: Initial Impression, ED Course, Assessment and Plan   Per my interpretation the bedside cardiac monitoring shows: NSR rate 66.  The following chart(s) was reviewed: 11/18/2022 OSH ED note for Herpes Zoster.  Medical Decision Making 68 year old male presents with rash after taking naproxen.  The patient also took a cephalosporin earlier today.  There is no evidence of anaphylaxis. No wheezing no oropharyngeal swelling no hypotension.  IM epinephrine  is not indicated.  The patient took 50 mg of Benadryl  at home p.o.  The patient was given Pepcid and Solu-Medrol  intravenously here.  The patient was given IV fluids.  I have gone over the indications and when to use an epinephrine  autoinjector  in the case of anaphylaxis.  The patient will be prescribed an EpiPen .  The patient will use cetirizine for the next 30 days and twice daily famotidine for the next 5 days.  The patient will be referred to allergy.  I have placed cephalosporin and NSAIDs as an allergy for this patient.  The patient understands  to avoid penicillins cephalosporins and NSAIDs.  I have also warned him to avoid silicone as he did have silicone on his back when symptoms started.  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  Urticaria has resolved on repeat examination.  Problems Addressed: Allergic reaction to nonsteroidal anti-inflammatory drug (NSAID): complicated acute illness or injury Allergic reaction, initial encounter: complicated acute illness or injury Allergy to cephalosporin: complicated acute illness or injury Urticaria: complicated acute illness or injury  Risk OTC drugs. Prescription drug management.    Clinical Complexity  Patient's presentation is most consistent with acute presentation with potential threat to life or bodily function.  Patient's history of Herpes Zoster increases the complexity of managing their  presentation with allergic reaction.    Provider time spent in patient care today, inclusive of but not limited to clinical reassessment, review of diagnostic studies, and discharge preparation, was greater than 30 minutes.

## 2023-09-18 NOTE — Telephone Encounter (Signed)
 Patient will have pcp fill medication

## 2023-09-19 ENCOUNTER — Ambulatory Visit: Payer: Self-pay | Admitting: Internal Medicine

## 2023-09-19 NOTE — Progress Notes (Deleted)
 NEW PATIENT Date of Service/Encounter:   09/19/2023 Referring provider: {Blank single:19197::No ref. provider found,none-self referred} Primary care provider: Roxene Savant, NP  Subjective:  Carlos Ross. Degnan is a 68 y.o. male with a PMHx of *** presenting today for evaluation of *** History obtained from: chart review and {Persons; PED relatives w/patient:19415::patient}.   Discussed the use of AI scribe software for clinical note transcription with the patient, who gave verbal consent to proceed.  History of Present Illness      Chart Review:  ED visit 08/21/23; allergic reaction. The patient took an antibiotic which his sister gave him for a toothache. The patient took a dose of Cefaclor at 2 PM. The patient also took a dose of Naproxen (two tablets) at 9:30 PM. Allergic reaction began at 10:30 PM. The patient reports his feet began to itch. The patient then noticed diffuse hives/urticaria to the body. The patient reports allergy to Nexium, sesame seeds, [employer] sauce. The patient took two (50 mg total) Benadryl  tablets at 10:30 PM. No dysphagia. No tongue swelling. No SOB and no wheezing.   Other allergy screening: Asthma: {Blank single:19197::yes,no} Rhino conjunctivitis: {Blank single:19197::yes,no} Food allergy: {Blank single:19197::yes,no} Medication allergy: {Blank single:19197::yes,no} Hymenoptera allergy: {Blank single:19197::yes,no} Urticaria: {Blank single:19197::yes,no} Eczema:{Blank single:19197::yes,no} History of recurrent infections suggestive of immunodeficency: {Blank single:19197::yes,no} ***Vaccinations are up to date.   Past Medical History: Past Medical History:  Diagnosis Date   Diabetes mellitus without complication (HCC)    GERD (gastroesophageal reflux disease)    Hypertension    Renal disorder    Medication List:  Current Outpatient Medications  Medication Sig Dispense Refill   albuterol  (VENTOLIN   HFA) 108 (90 Base) MCG/ACT inhaler Inhale 1-2 puffs into the lungs every 6 (six) hours as needed for wheezing or shortness of breath. 6.7 g 0   alfuzosin (UROXATRAL) 10 MG 24 hr tablet Take 10 mg by mouth daily with breakfast.     dicyclomine  (BENTYL ) 20 MG tablet Take 1 tablet (20 mg total) by mouth 2 (two) times daily for 7 days. 14 tablet 0   diphenhydrAMINE  (BENADRYL ) 25 MG tablet Take 2 tablets (50 mg total) by mouth every 4 (four) hours as needed for itching. 20 tablet 0   EPINEPHrine  (EPIPEN  2-PAK) 0.3 mg/0.3 mL IJ SOAJ injection Inject 0.3 mLs (0.3 mg total) into the muscle once as needed (for severe allergic reaction). CAll 911 immediately if you have to use this medicine 2 Device 2   fluticasone (FLONASE) 50 MCG/ACT nasal spray Place 2 sprays into both nostrils daily.     gabapentin  (NEURONTIN ) 300 MG capsule Take 1 capsule (300 mg total) by mouth 2 (two) times daily as needed (neuropathic pain). 30 capsule 0   metFORMIN (GLUCOPHAGE) 500 MG tablet Take 500 mg by mouth 2 (two) times daily with a meal.     naproxen (NAPROSYN) 500 MG tablet Take 500 mg by mouth 2 (two) times daily as needed for moderate pain.     predniSONE  (DELTASONE ) 50 MG tablet Take 1 tablet daily with breakfast 5 tablet 0   Tolterodine Tartrate (DETROL PO) Take 1 tablet by mouth daily.     valACYclovir  (VALTREX ) 1000 MG tablet Take 1 tablet (1,000 mg total) by mouth 3 (three) times daily. 21 tablet 0   No current facility-administered medications for this visit.   Known Allergies:  Allergies  Allergen Reactions   Nexium [Esomeprazole Magnesium] Hives   Nexium [Esomeprazole Magnesium]    Past Surgical History: No past surgical history on file.  Family History: No family history on file. Social History: Izack lives ***.   ROS:  All other systems negative except as noted per HPI.  Objective:  There were no vitals taken for this visit. There is no height or weight on file to calculate BMI. Physical  Exam:  General Appearance:  Alert, cooperative, no distress, appears stated age  Head:  Normocephalic, without obvious abnormality, atraumatic  Eyes:  Conjunctiva clear, EOM's intact  Ears {Blank multiple:19196:a:***,EACs normal bilaterally,normal TMs bilaterally,ear tubes present bilaterally without exudate}  Nose: Nares normal, {Blank multiple:19196:a:***,hypertrophic turbinates,normal mucosa,no visible anterior polyps,septum midline}  Throat: Lips, tongue normal; teeth and gums normal, {Blank multiple:19196:a:***,normal posterior oropharynx,tonsils 2+,tonsils 3+,no tonsillar exudate,+ cobblestoning,surgically absent tonsils,mildly erythematous posterior oropharynx}  Neck: Supple, symmetrical  Lungs:   {Blank multiple:19196:a:***,clear to auscultation bilaterally,end-expiratory wheezing,wheezing throughout}, Respirations unlabored, {Blank multiple:19196:a:***,no coughing,intermittent dry coughing,intermittent productive-sounding cough}  Heart:  {Blank multiple:19196:a:***,regular rate and rhythm,no murmur}, Appears well perfused  Extremities: No edema  Skin: {Blank multiple:19196:a:***,erythematous, dry patches scattered on ***,lichenification on ***,Skin color, texture, turgor normal,no rashes or lesions on visualized portions of skin}  Neurologic: No gross deficits   Diagnostics: Spirometry:  Tracings reviewed. His effort: {Blank single:19197::Good reproducible efforts.,It was hard to get consistent efforts and there is a question as to whether this reflects a maximal maneuver.,Poor effort, data can not be interpreted.,Variable effort-results affected,effort okay for first attempt at spirometry.,Results not reproducible due to ***} FVC: ***L (pre), ***L  (post) FEV1: ***L, ***% predicted (pre), ***L, ***% predicted (post) FEV1/FVC ratio: *** (pre), *** (post) Interpretation: {Blank single:19197::Spirometry  consistent with mild obstructive disease,Spirometry consistent with moderate obstructive disease,Spirometry consistent with severe obstructive disease,Spirometry consistent with possible restrictive disease,Spirometry consistent with mixed obstructive and restrictive disease,Spirometry uninterpretable due to technique,Spirometry consistent with normal pattern,No overt abnormalities noted given today's efforts,Nonobstructive ratio, low FEV1,Nonobstructive ratio, low FEV1, possible restriction}.  Please see scanned spirometry results for details.  Skin Testing: {Blank single:19197::Select foods,Environmental allergy panel,Environmental allergy panel and select foods,Food allergy panel,None,Deferred due to recent antihistamines use,deferred due to recent reaction,Pediatric Environmental Allergy Panel,Pediatric Food Panel,Select foods and environmental allergies}. {Blank single:19197::Adequate positive and negative controls,Inadequate positive control-testing invalid,Adequate positive and negative controls, dermatographism present, testing difficult to interpret}. Results discussed with patient/family.   {Blank single:19197::Allergy testing results were read and interpreted by myself, documented by clinical staff.,Allergy testing results were read by ***,FNP, documented by clinical staff}  Labs:  Lab Orders  No laboratory test(s) ordered today     Assessment and Plan  Assessment and Plan Assessment & Plan       {Blank single:19197::This note in its entirety was forwarded to the Provider who requested this consultation.}  Other: {Blank multiple:19196:a:***,samples provided of: ***,school forms provided,reviewed spirometry technique,reviewed inhaler technique}  Thank you for your kind referral. I appreciate the opportunity to take part in Lonell's care. Please do not hesitate to contact me with questions.***  Sincerely,  Rocky Endow, MD [facility] 

## 2023-12-12 ENCOUNTER — Other Ambulatory Visit: Payer: Self-pay | Admitting: Family

## 2023-12-12 DIAGNOSIS — D649 Anemia, unspecified: Secondary | ICD-10-CM

## 2023-12-13 ENCOUNTER — Inpatient Hospital Stay: Admitting: Family

## 2023-12-13 ENCOUNTER — Encounter: Payer: Self-pay | Admitting: Family

## 2023-12-13 ENCOUNTER — Inpatient Hospital Stay: Attending: Hematology & Oncology

## 2023-12-13 ENCOUNTER — Other Ambulatory Visit: Payer: Self-pay | Admitting: Family

## 2023-12-13 VITALS — BP 120/66 | HR 60 | Temp 97.6°F | Resp 18 | Ht 68.0 in | Wt 107.8 lb

## 2023-12-13 DIAGNOSIS — E119 Type 2 diabetes mellitus without complications: Secondary | ICD-10-CM | POA: Diagnosis not present

## 2023-12-13 DIAGNOSIS — D631 Anemia in chronic kidney disease: Secondary | ICD-10-CM

## 2023-12-13 DIAGNOSIS — Z79899 Other long term (current) drug therapy: Secondary | ICD-10-CM | POA: Diagnosis not present

## 2023-12-13 DIAGNOSIS — D649 Anemia, unspecified: Secondary | ICD-10-CM | POA: Insufficient documentation

## 2023-12-13 DIAGNOSIS — F17210 Nicotine dependence, cigarettes, uncomplicated: Secondary | ICD-10-CM | POA: Insufficient documentation

## 2023-12-13 DIAGNOSIS — J449 Chronic obstructive pulmonary disease, unspecified: Secondary | ICD-10-CM | POA: Insufficient documentation

## 2023-12-13 DIAGNOSIS — Z7984 Long term (current) use of oral hypoglycemic drugs: Secondary | ICD-10-CM | POA: Insufficient documentation

## 2023-12-13 DIAGNOSIS — I10 Essential (primary) hypertension: Secondary | ICD-10-CM | POA: Diagnosis not present

## 2023-12-13 LAB — RETICULOCYTES
Immature Retic Fract: 0.8 % — ABNORMAL LOW (ref 2.3–15.9)
RBC.: 3.49 MIL/uL — ABNORMAL LOW (ref 4.22–5.81)
Retic Count, Absolute: 20.9 K/uL (ref 19.0–186.0)
Retic Ct Pct: 0.6 % (ref 0.4–3.1)

## 2023-12-13 LAB — CMP (CANCER CENTER ONLY)
ALT: 30 U/L (ref 0–44)
AST: 31 U/L (ref 15–41)
Albumin: 4.3 g/dL (ref 3.5–5.0)
Alkaline Phosphatase: 56 U/L (ref 38–126)
Anion gap: 11 (ref 5–15)
BUN: 23 mg/dL (ref 8–23)
CO2: 23 mmol/L (ref 22–32)
Calcium: 9.4 mg/dL (ref 8.9–10.3)
Chloride: 106 mmol/L (ref 98–111)
Creatinine: 1.21 mg/dL (ref 0.61–1.24)
GFR, Estimated: >60 mL/min (ref >60)
Glucose, Bld: 91 mg/dL (ref 70–99)
Potassium: 5.5 mmol/L — ABNORMAL HIGH (ref 3.5–5.1)
Sodium: 140 mmol/L (ref 135–145)
Total Bilirubin: 0.4 mg/dL (ref 0.0–1.2)
Total Protein: 6.7 g/dL (ref 6.5–8.1)

## 2023-12-13 LAB — CBC WITH DIFFERENTIAL (CANCER CENTER ONLY)
Abs Immature Granulocytes: 0.01 K/uL (ref 0.00–0.07)
Basophils Absolute: 0.1 K/uL (ref 0.0–0.1)
Basophils Relative: 2 %
Eosinophils Absolute: 0.2 K/uL (ref 0.0–0.5)
Eosinophils Relative: 6 %
HCT: 30.7 % — ABNORMAL LOW (ref 39.0–52.0)
Hemoglobin: 10.2 g/dL — ABNORMAL LOW (ref 13.0–17.0)
Immature Granulocytes: 0 %
Lymphocytes Relative: 43 %
Lymphs Abs: 1.4 K/uL (ref 0.7–4.0)
MCH: 29.4 pg (ref 26.0–34.0)
MCHC: 33.2 g/dL (ref 30.0–36.0)
MCV: 88.5 fL (ref 80.0–100.0)
Monocytes Absolute: 0.3 K/uL (ref 0.1–1.0)
Monocytes Relative: 11 %
Neutro Abs: 1.2 K/uL — ABNORMAL LOW (ref 1.7–7.7)
Neutrophils Relative %: 38 %
Platelet Count: 172 K/uL (ref 150–400)
RBC: 3.47 MIL/uL — ABNORMAL LOW (ref 4.22–5.81)
RDW: 13.2 % (ref 11.5–15.5)
WBC Count: 3.1 K/uL — ABNORMAL LOW (ref 4.0–10.5)
nRBC: 0 % (ref 0.0–0.2)

## 2023-12-13 LAB — IRON AND IRON BINDING CAPACITY (CC-WL,HP ONLY)
Iron: 79 ug/dL (ref 45–182)
Saturation Ratios: 26 % (ref 17.9–39.5)
TIBC: 309 ug/dL (ref 250–450)
UIBC: 230 ug/dL

## 2023-12-13 LAB — SAMPLE TO BLOOD BANK

## 2023-12-13 LAB — FOLATE: Folate: >20 ng/mL (ref >5.9)

## 2023-12-13 LAB — VITAMIN B12: Vitamin B-12: 435 pg/mL (ref 180–914)

## 2023-12-13 LAB — FERRITIN: Ferritin: 276 ng/mL (ref 24–336)

## 2023-12-13 NOTE — Progress Notes (Unsigned)
 Hematology/Oncology Consultation   Name: Carlos Ross      MRN: [id number]    Location: Room/bed info not found  Date: 12/13/2023 Time:3:28 PM   REFERRING PHYSICIAN:  Sydelle Fritz, NP  REASON FOR CONSULT:  Anemia    DIAGNOSIS: Anemia   HISTORY OF PRESENT ILLNESS:  Carlos Ross is a very pleasant 68 yo African American gentleman with anemia.  He has history of IDA and has taken an oral supplement in the past.  He has not noted any obvious blood loss. No bruising or petechiae.  He is symptomatic with fatigue at times.  His wife states that he has lost around 30 lbs over the last 2 years.  He states that he can not taste food anymore. Despite this he states he is still eating well and supplementing protein with Ensure daily. He feels that he is staying well hydrated. No adenopathy or lymphedema noted on exam.  He had a recent CT of the abdomen and pelvis with PCP that was negative.  He has an appointment coming up in November (12th) with GI for further evaluation.  His past surgical history includes kidney stone removal and cervical fusion. He denies any complications with these procedures.  He smokes 1 ppd since the age of 27. He states that he has annual CT lung screening with his PCP to monitor.  He has history of COPD with chronic cough that sometimes produces grey phlegm.  No known history of thrombotic event.  No history of diabetes or thyroid disease.  No personal history of cancer. His nephew had an unknown primary.  He denies fever, chills, n/v, cough, rash, dizziness, SOB, chest pain, palpitations, abdominal pain or changes in bowel or bladder habits at this time.  No swelling, tenderness, numbness or tingling in his extremities.  No falls or syncope.  He works as a Financial risk analyst at [employer].   ROS: All other 10 point review of systems is negative.   PAST MEDICAL HISTORY:   Past Medical History:  Diagnosis Date   Diabetes mellitus without complication (HCC)     GERD (gastroesophageal reflux disease)    Hypertension    Renal disorder     ALLERGIES: Allergies  Allergen Reactions   Nexium [Esomeprazole Magnesium] Hives   Nexium [Esomeprazole Magnesium]       MEDICATIONS:  Current Outpatient Medications on File Prior to Visit  Medication Sig Dispense Refill   albuterol  (VENTOLIN  HFA) 108 (90 Base) MCG/ACT inhaler Inhale 1-2 puffs into the lungs every 6 (six) hours as needed for wheezing or shortness of breath. 6.7 g 0   alfuzosin (UROXATRAL) 10 MG 24 hr tablet Take 10 mg by mouth daily with breakfast.     dicyclomine  (BENTYL ) 20 MG tablet Take 1 tablet (20 mg total) by mouth 2 (two) times daily for 7 days. 14 tablet 0   diphenhydrAMINE  (BENADRYL ) 25 MG tablet Take 2 tablets (50 mg total) by mouth every 4 (four) hours as needed for itching. 20 tablet 0   EPINEPHrine  (EPIPEN  2-PAK) 0.3 mg/0.3 mL IJ SOAJ injection Inject 0.3 mLs (0.3 mg total) into the muscle once as needed (for severe allergic reaction). CAll 911 immediately if you have to use this medicine 2 Device 2   fluticasone (FLONASE) 50 MCG/ACT nasal spray Place 2 sprays into both nostrils daily.     gabapentin  (NEURONTIN ) 300 MG capsule Take 1 capsule (300 mg total) by mouth 2 (two) times daily as needed (neuropathic pain). 30 capsule 0   metFORMIN (  GLUCOPHAGE) 500 MG tablet Take 500 mg by mouth 2 (two) times daily with a meal.     naproxen (NAPROSYN) 500 MG tablet Take 500 mg by mouth 2 (two) times daily as needed for moderate pain.     predniSONE  (DELTASONE ) 50 MG tablet Take 1 tablet daily with breakfast 5 tablet 0   Tolterodine Tartrate (DETROL PO) Take 1 tablet by mouth daily.     valACYclovir  (VALTREX ) 1000 MG tablet Take 1 tablet (1,000 mg total) by mouth 3 (three) times daily. 21 tablet 0   No current facility-administered medications on file prior to visit.     PAST SURGICAL HISTORY No past surgical history on file.  FAMILY HISTORY: No family history on file.  SOCIAL  HISTORY:  reports that he has been smoking. He has never used smokeless tobacco. He reports current alcohol use. He reports that he does not use drugs.  PERFORMANCE STATUS: The patient's performance status is 1 - Symptomatic but completely ambulatory  PHYSICAL EXAM: Most Recent Vital Signs: There were no vitals taken for this visit. BP 120/66 (BP Location: Left Arm, Patient Position: Sitting)   Pulse 60   Temp 97.6 F (36.4 C) (Oral)   Resp 18   Ht 5' 8 (1.727 m)   Wt 107 lb 12.8 oz (48.9 kg)   SpO2 100%   BMI 16.39 kg/m   General Appearance:    Alert, cooperative, no distress, appears stated age  Head:    Normocephalic, without obvious abnormality, atraumatic  Eyes:    PERRL, conjunctiva/corneas clear, EOM's intact, fundi    benign, both eyes             Throat:   Lips, mucosa, and tongue normal; teeth and gums normal  Neck:   Supple, symmetrical, trachea midline, no adenopathy;       thyroid:  No enlargement/tenderness/nodules; no carotid   bruit or JVD  Back:     Symmetric, no curvature, ROM normal, no CVA tenderness  Lungs:     Clear to auscultation bilaterally, respirations unlabored  Chest wall:    No tenderness or deformity  Heart:    Regular rate and rhythm, S1 and S2 normal, no murmur, rub   or gallop  Abdomen:     Soft, non-tender, bowel sounds active all four quadrants,    no masses, no organomegaly        Extremities:   Extremities normal, atraumatic, no cyanosis or edema  Pulses:   2+ and symmetric all extremities  Skin:   Skin color, texture, turgor normal, no rashes or lesions  Lymph nodes:   Cervical, supraclavicular, and axillary nodes normal  Neurologic:   CNII-XII intact. Normal strength, sensation and reflexes      throughout    LABORATORY DATA:  No results found for this or any previous visit (from the past 48 hours).    RADIOGRAPHY: No results found.     PATHOLOGY: None  ASSESSMENT/PLAN: Carlos Ross is a very pleasant 68 yo African  American gentleman with anemia.  His iron studies, B 12 and folate are stable.  Epo is low at only 7 and reticulocytes 0.8. We will get him started on Aranesp.  We will start injection next week with follow-up in 1 month.   All questions were answered. The patient knows to call the clinic with any problems, questions or concerns. We can certainly see the patient much sooner if necessary.  The patient was discussed with Dr. Timmy and he is in agreement  with the aforementioned.   Lauraine Pepper, NP

## 2023-12-15 ENCOUNTER — Encounter: Payer: Self-pay | Admitting: Family

## 2023-12-15 DIAGNOSIS — D631 Anemia in chronic kidney disease: Secondary | ICD-10-CM | POA: Insufficient documentation

## 2023-12-15 LAB — ERYTHROPOIETIN: Erythropoietin: 7 m[IU]/mL (ref 2.6–18.5)

## 2023-12-21 ENCOUNTER — Inpatient Hospital Stay

## 2023-12-21 VITALS — BP 129/64 | HR 69 | Temp 97.7°F | Resp 18

## 2023-12-21 DIAGNOSIS — D649 Anemia, unspecified: Secondary | ICD-10-CM | POA: Diagnosis not present

## 2023-12-21 DIAGNOSIS — D631 Anemia in chronic kidney disease: Secondary | ICD-10-CM

## 2023-12-21 MED ORDER — DARBEPOETIN ALFA 300 MCG/0.6ML IJ SOSY
300.0000 ug | PREFILLED_SYRINGE | Freq: Once | INTRAMUSCULAR | Status: AC
  Administered 2023-12-21: 300 ug via SUBCUTANEOUS
  Filled 2023-12-21: qty 0.6

## 2023-12-21 NOTE — Patient Instructions (Signed)

## 2024-01-15 ENCOUNTER — Inpatient Hospital Stay: Attending: Hematology & Oncology

## 2024-01-15 ENCOUNTER — Inpatient Hospital Stay

## 2024-01-15 ENCOUNTER — Encounter: Payer: Self-pay | Admitting: Family

## 2024-01-15 ENCOUNTER — Inpatient Hospital Stay: Admitting: Family

## 2024-01-15 VITALS — BP 130/74 | HR 62 | Temp 97.4°F | Resp 20 | Ht 68.0 in | Wt 113.0 lb

## 2024-01-15 DIAGNOSIS — D72819 Decreased white blood cell count, unspecified: Secondary | ICD-10-CM

## 2024-01-15 DIAGNOSIS — D539 Nutritional anemia, unspecified: Secondary | ICD-10-CM | POA: Insufficient documentation

## 2024-01-15 DIAGNOSIS — D631 Anemia in chronic kidney disease: Secondary | ICD-10-CM

## 2024-01-15 LAB — CMP (CANCER CENTER ONLY)
ALT: 16 U/L (ref 0–44)
AST: 24 U/L (ref 15–41)
Albumin: 4.5 g/dL (ref 3.5–5.0)
Alkaline Phosphatase: 64 U/L (ref 38–126)
Anion gap: 11 (ref 5–15)
BUN: 15 mg/dL (ref 8–23)
CO2: 25 mmol/L (ref 22–32)
Calcium: 9.2 mg/dL (ref 8.9–10.3)
Chloride: 105 mmol/L (ref 98–111)
Creatinine: 0.91 mg/dL (ref 0.61–1.24)
GFR, Estimated: >60 mL/min (ref >60)
Glucose, Bld: 82 mg/dL (ref 70–99)
Potassium: 4.1 mmol/L (ref 3.5–5.1)
Sodium: 141 mmol/L (ref 135–145)
Total Bilirubin: 0.5 mg/dL (ref 0.0–1.2)
Total Protein: 7 g/dL (ref 6.5–8.1)

## 2024-01-15 LAB — CBC WITH DIFFERENTIAL (CANCER CENTER ONLY)
Abs Immature Granulocytes: 0.01 K/uL (ref 0.00–0.07)
Basophils Absolute: 0.1 K/uL (ref 0.0–0.1)
Basophils Relative: 2 %
Eosinophils Absolute: 0.1 K/uL (ref 0.0–0.5)
Eosinophils Relative: 5 %
HCT: 39 % (ref 39.0–52.0)
Hemoglobin: 12.6 g/dL — ABNORMAL LOW (ref 13.0–17.0)
Immature Granulocytes: 0 %
Lymphocytes Relative: 44 %
Lymphs Abs: 1.2 K/uL (ref 0.7–4.0)
MCH: 29.9 pg (ref 26.0–34.0)
MCHC: 32.3 g/dL (ref 30.0–36.0)
MCV: 92.4 fL (ref 80.0–100.0)
Monocytes Absolute: 0.4 K/uL (ref 0.1–1.0)
Monocytes Relative: 13 %
Neutro Abs: 1 K/uL — ABNORMAL LOW (ref 1.7–7.7)
Neutrophils Relative %: 36 %
Platelet Count: 155 K/uL (ref 150–400)
RBC: 4.22 MIL/uL (ref 4.22–5.81)
RDW: 14.3 % (ref 11.5–15.5)
WBC Count: 2.8 K/uL — ABNORMAL LOW (ref 4.0–10.5)
nRBC: 0 % (ref 0.0–0.2)

## 2024-01-15 LAB — LACTATE DEHYDROGENASE: LDH: 174 U/L (ref 98–192)

## 2024-01-15 LAB — SAVE SMEAR(SSMR), FOR PROVIDER SLIDE REVIEW

## 2024-01-15 NOTE — Progress Notes (Unsigned)
 Hematology and Oncology Follow Up Visit  Carlos Ross [id number] January 03, 1956 68 y.o. 01/15/2024   Principle Diagnosis:  Erythropoietin deficiency anemia   Current Therapy:   Aranesp 300 mcg SQ PRN for Hgb < 11   Interim History:  Carlos Ross is here today for follow-up. He is doing quite well and has no complaints at this time.  He has responded nicely to the Aranesp. Hgb is now 12.7. WBC count is 12.6, MCV 92, platelets 155.  No issues with infection  per patient.  No fever, chills, n/v, cough, rash, dizziness, SOB , chest pain, palpitations, abdominal pain or changes in bowel or bladder habits.  No swelling, tenderness, numbness or tingling in his extremities.  No falls or syncope.  Appetite and hydration are good. Weight is stable/improved at 113 lbs.   ECOG Performance Status: 1 - Symptomatic but completely ambulatory  Medications:  Allergies as of 01/15/2024       Reactions   Naproxen Hives, Shortness Of Breath   Sesame Oil Hives, Itching   Soy Allergy (obsolete) Hives, Shortness Of Breath, Dermatitis   Nexium [esomeprazole Magnesium] Hives   Nexium [esomeprazole Magnesium] Hives   Cefaclor Hives   Wheat Itching        Medication List        Accurate as of January 15, 2024  2:23 PM. If you have any questions, ask your nurse or doctor.          albuterol  108 (90 Base) MCG/ACT inhaler Commonly known as: VENTOLIN  HFA Inhale 1-2 puffs into the lungs every 6 (six) hours as needed for wheezing or shortness of breath.   alfuzosin 10 MG 24 hr tablet Commonly known as: UROXATRAL Take 10 mg by mouth daily with breakfast.   amitriptyline 10 MG tablet Commonly known as: ELAVIL Take 10 mg by mouth at bedtime.   atorvastatin 20 MG tablet Commonly known as: LIPITOR Take 20 mg by mouth daily.   cetirizine 10 MG tablet Commonly known as: ZYRTEC Take 10 mg by mouth daily.   Clobetasol Prop Emollient Base 0.05 % emollient cream Apply 1 Application  topically 2 (two) times daily.   diphenhydrAMINE  25 MG tablet Commonly known as: Benadryl  Take 2 tablets (50 mg total) by mouth every 4 (four) hours as needed for itching.   EPINEPHrine  0.3 mg/0.3 mL Soaj injection Commonly known as: EpiPen  2-Pak Inject 0.3 mLs (0.3 mg total) into the muscle once as needed (for severe allergic reaction). CAll 911 immediately if you have to use this medicine   hydrochlorothiazide 25 MG tablet Commonly known as: HYDRODIURIL Take 25 mg by mouth daily.   lisinopril 10 MG tablet Commonly known as: ZESTRIL Take 10 mg by mouth daily.   omeprazole 40 MG capsule Commonly known as: PRILOSEC Take 40 mg by mouth every morning.   Symbicort 80-4.5 MCG/ACT inhaler Generic drug: budesonide-formoterol Inhale 2 puffs into the lungs every 12 (twelve) hours.        Allergies:  Allergies  Allergen Reactions   Naproxen Hives and Shortness Of Breath   Sesame Oil Hives and Itching   Soy Allergy (Obsolete) Hives, Shortness Of Breath and Dermatitis   Nexium [Esomeprazole Magnesium] Hives   Nexium [Esomeprazole Magnesium] Hives   Cefaclor Hives   Wheat Itching    Past Medical History, Surgical history, Social history, and Family History were reviewed and updated.  Review of Systems: All other 10 point review of systems is negative.   Physical Exam:  vitals were not taken for  this visit.   Wt Readings from Last 3 Encounters:  12/13/23 107 lb 12.8 oz (48.9 kg)  11/18/22 127 lb (57.6 kg)  12/15/18 126 lb (57.2 kg)    Ocular: Sclerae unicteric, pupils equal, round and reactive to light Ear-nose-throat: Oropharynx clear, dentition fair Lymphatic: No cervical or supraclavicular adenopathy Lungs no rales or rhonchi, good excursion bilaterally Heart regular rate and rhythm, no murmur appreciated Abd soft, nontender, positive bowel sounds MSK no focal spinal tenderness, no joint edema Neuro: non-focal, well-oriented, appropriate affect Breasts:  Deferred  Lab Results  Component Value Date   WBC 2.8 (L) 01/15/2024   HGB 12.6 (L) 01/15/2024   HCT 39.0 01/15/2024   MCV 92.4 01/15/2024   PLT 155 01/15/2024   Lab Results  Component Value Date   FERRITIN 276 12/13/2023   IRON 79 12/13/2023   TIBC 309 12/13/2023   UIBC 230 12/13/2023   IRONPCTSAT 26 12/13/2023   Lab Results  Component Value Date   RETICCTPCT 0.6 12/13/2023   RBC 4.22 01/15/2024   No results found for: KPAFRELGTCHN, LAMBDASER, KAPLAMBRATIO No results found for: IGGSERUM, IGA, IGMSERUM No results found for: STEPHANY CARLOTA BENSON MARKEL EARLA JOANNIE DOC VICK, SPEI   Chemistry      Component Value Date/Time   NA 140 12/13/2023 1525   K 5.5 (H) 12/13/2023 1525   CL 106 12/13/2023 1525   CO2 23 12/13/2023 1525   BUN 23 12/13/2023 1525   CREATININE 1.21 12/13/2023 1525      Component Value Date/Time   CALCIUM 9.4 12/13/2023 1525   ALKPHOS 56 12/13/2023 1525   AST 31 12/13/2023 1525   ALT 30 12/13/2023 1525   BILITOT 0.4 12/13/2023 1525       Impression and Plan: Carlos Ross is a very pleasant 68 yo African American gentleman with erythropoietin deficiency anemia.  No ESA needed this visit. Hgb is 12.6.  CBC with diff and blood smear reviewed with Dr. Timmy. No abnormality or evidence of malignancy noted on smear.  Follow-up in 1 month.   Lauraine Pepper, NP 11/10/20252:23 PM

## 2024-01-16 ENCOUNTER — Encounter: Payer: Self-pay | Admitting: Family

## 2024-01-17 ENCOUNTER — Encounter: Payer: Self-pay | Admitting: Gastroenterology

## 2024-01-17 ENCOUNTER — Ambulatory Visit: Admitting: Gastroenterology

## 2024-01-17 VITALS — BP 118/68 | HR 76 | Ht 68.0 in | Wt 111.5 lb

## 2024-01-17 DIAGNOSIS — R1319 Other dysphagia: Secondary | ICD-10-CM

## 2024-01-17 DIAGNOSIS — R432 Parageusia: Secondary | ICD-10-CM

## 2024-01-17 DIAGNOSIS — R1013 Epigastric pain: Secondary | ICD-10-CM

## 2024-01-17 DIAGNOSIS — R634 Abnormal weight loss: Secondary | ICD-10-CM

## 2024-01-17 DIAGNOSIS — Z8719 Personal history of other diseases of the digestive system: Secondary | ICD-10-CM

## 2024-01-17 DIAGNOSIS — D509 Iron deficiency anemia, unspecified: Secondary | ICD-10-CM

## 2024-01-17 MED ORDER — NA SULFATE-K SULFATE-MG SULF 17.5-3.13-1.6 GM/177ML PO SOLN: 1.0000 | Freq: Once | ORAL | 0 refills | Status: AC

## 2024-01-17 NOTE — Patient Instructions (Addendum)
 You have been scheduled for an endoscopy and colonoscopy. Please follow the written instructions given to you at your visit today.  If you use inhalers (even only as needed), please bring them with you on the day of your procedure.  DO NOT TAKE 7 DAYS PRIOR TO TEST- Trulicity (dulaglutide) Ozempic, Wegovy (semaglutide) Mounjaro, Zepbound (tirzepatide) Bydureon Bcise (exanatide extended release)  DO NOT TAKE 1 DAY PRIOR TO YOUR TEST Rybelsus (semaglutide) Adlyxin (lixisenatide) Victoza (liraglutide) Byetta (exanatide) ___________________________________________________________________________

## 2024-01-17 NOTE — Progress Notes (Signed)
 Carlos Ross [id number] 1955-12-12   Chief Complaint: Weight loss  Referring Provider: Roxene Savant, NP Primary GI MD: Sampson  HPIBETHA Pagoda D. Ross is a 68 y.o. male with past medical history of diabetes, GERD, HTN, COPD who presents today for a complaint of weight loss.    Referred by [facility] for evaluation of unexplained weight loss.  Per referral note has been losing weight despite use of supplemental nutritional shakes.  Has had loss of taste.  Appears to have chronic anemia, per referral note they would like to consider EGD and colonoscopy to look for potential GI bleed. CT A/P 10/31/2023 reportedly with impression of no acute abnormality.  Iron panel 11/15/2023 showed a normal ferritin of 300, iron 59, normal percent saturation.  Mild elevation in LFTs with AST 44, ALT 84 and otherwise normal CMP.  Hemoglobin 10.9, hematocrit 33.1, normal MCV, normal platelet count.  Lung cancer screening CT 05/15/2023 was stable with recommendation for repeat in 12 months.  Recent hospital admission in July for dehydration, AKI, decreased oral intake, loss of taste, hypomagnesemia.  Has was also referred to oncology for anemia and to rule out primary bone marrow disorder.  Had initial consult 12/13/2023.  Noted to have stable B12, folate, and iron studies.  EPO was low and was started on Aranesp.  Had follow-up 01/15/2024, doing well and responding well to Aranesp with hemoglobin up to 12.7.  Weight stable/improved at 113 pounds.  Planning for follow-up in 1 month.   Past GI history: Seen by digestive health in [facility] 02/2022 for iron deficiency anemia, had been referred by [facility].  CBC at that time notable for hemoglobin 11.8, MCV 91, platelets 230, iron panel was not drawn.  Reported having a colonoscopy done 2 to 3 years prior but no prior EGD.  Labs were repeated to include iron panel, B12, and folate levels.  If evidence of iron deficiency anemia plan  was for EGD to further evaluate.  He had an EGD 12/29/2022 with [facility] for epigastric pain and hematemesis which showed a normal esophagus and chronic active gastritis without evidence of H. pylori.  Looks like he may have had a colonoscopy a week GI in [place] around 2017/2018.  Had an initial consult.   Discussed the use of AI scribe software for clinical note transcription with the patient, who gave verbal consent to proceed.  History of Present Illness Carlos Ross is a 68 year old male who presents with unintentional weight loss and abdominal pain. He was referred by his primary care physician due to concerns over unintentional weight loss.  Unintentional weight loss and anorexia - Unintentional weight loss of approximately 20 pounds over the past six months (from 145 pounds to 111 pounds) - Decreased appetite and significant change in taste since July 2025, following a hospital admission for dehydration and kidney injury - Finds food unappealing and eats less, but does not experience early satiety - Supplements diet with Ensure Plus  Abdominal pain - Persistent abdominal pain described as spasms, constant since symptom onset - Intermittent pain episodes that 'hit out of nowhere' - Upper endoscopy in October 2024 revealed mild gastritis; no other significant findings - Currently taking omeprazole since the endoscopy - No acid reflux or heartburn  Dysphagia and odynophagia - Difficulty swallowing, particularly with meats such as cheeseburgers - Sensation of food scratching the throat and occasional regurgitation - Worsening of symptoms since spinal surgery with screw placement  Gastrointestinal symptoms -  No nausea, vomiting, constipation, diarrhea, hematochezia, or melena - Two prior colonoscopies, most recent possibly during the COVID-19 pandemic, with no abnormal findings  Relevant medical history - COPD managed with inhalers - Anemia stable under  hematology/oncology care - No family history of esophageal, stomach, or colon cancer   Previous GI Procedures/Imaging      Past Medical History:  Diagnosis Date   Diabetes mellitus without complication (HCC)    GERD (gastroesophageal reflux disease)    Hypertension    Renal disorder     No past surgical history on file.  Current Outpatient Medications  Medication Sig Dispense Refill   albuterol  (VENTOLIN  HFA) 108 (90 Base) MCG/ACT inhaler Inhale 1-2 puffs into the lungs every 6 (six) hours as needed for wheezing or shortness of breath. 6.7 g 0   alfuzosin (UROXATRAL) 10 MG 24 hr tablet Take 10 mg by mouth daily with breakfast.     amitriptyline (ELAVIL) 10 MG tablet Take 10 mg by mouth at bedtime.     atorvastatin (LIPITOR) 20 MG tablet Take 20 mg by mouth daily.     cetirizine (ZYRTEC) 10 MG tablet Take 10 mg by mouth daily.     Clobetasol Prop Emollient Base 0.05 % emollient cream Apply 1 Application topically 2 (two) times daily. (Patient not taking: Reported on 01/15/2024)     diphenhydrAMINE  (BENADRYL ) 25 MG tablet Take 2 tablets (50 mg total) by mouth every 4 (four) hours as needed for itching. 20 tablet 0   EPINEPHrine  (EPIPEN  2-PAK) 0.3 mg/0.3 mL IJ SOAJ injection Inject 0.3 mLs (0.3 mg total) into the muscle once as needed (for severe allergic reaction). CAll 911 immediately if you have to use this medicine 2 Device 2   hydrochlorothiazide (HYDRODIURIL) 25 MG tablet Take 25 mg by mouth daily.     lisinopril (ZESTRIL) 10 MG tablet Take 10 mg by mouth daily.     omeprazole (PRILOSEC) 40 MG capsule Take 40 mg by mouth every morning.     SYMBICORT 80-4.5 MCG/ACT inhaler Inhale 2 puffs into the lungs every 12 (twelve) hours.     No current facility-administered medications for this visit.    Allergies as of 01/17/2024 - Review Complete 01/15/2024  Allergen Reaction Noted   Naproxen Hives and Shortness Of Breath 08/21/2023   Sesame oil Hives and Itching 01/27/2016   Soy  allergy (obsolete) Hives, Shortness Of Breath, and Dermatitis 01/27/2016   Nexium [esomeprazole magnesium] Hives 04/07/2015   Nexium [esomeprazole magnesium] Hives 12/15/2018   Cefaclor Hives 08/21/2023   Wheat Itching 01/27/2016    No family history on file.  Social History   Tobacco Use   Smoking status: Every Day    Types: Cigarettes   Smokeless tobacco: Never   Tobacco comments:    Smokes 1 PPD  Vaping Use   Vaping status: Never Used  Substance Use Topics   Alcohol use: Not Currently   Drug use: No     Review of Systems:    Constitutional: Unintentional weight loss.  No fever or chills Cardiovascular: No chest pain Respiratory: No SOB Gastrointestinal: See HPI and otherwise negative   Physical Exam:  Vital signs: BP 118/68   Pulse 76   Ht 5' 8 (1.727 m)   Wt 111 lb 8 oz (50.6 kg)   SpO2 98%   BMI 16.95 kg/m    Constitutional: Pleasant, very thin male in NAD, alert and cooperative Head:  Normocephalic and atraumatic.  Eyes: No scleral icterus.  Mouth: No oral lesions.  Poor dentition. Respiratory: Respirations even and unlabored. Lungs clear to auscultation bilaterally.  No wheezes, crackles, or rhonchi.  Cardiovascular:  Regular rate and rhythm. No murmurs. No peripheral edema. Gastrointestinal:  Soft, nondistended, nontender. No rebound or guarding. Normal bowel sounds. No appreciable masses or hepatomegaly. Rectal:  Not performed.  Neurologic:  Alert and oriented x4;  grossly normal neurologically.  Skin:   Dry and intact without significant lesions or rashes. Psychiatric: Oriented to person, place and time. Demonstrates good judgement and reason without abnormal affect or behaviors.  RELEVANT LABS AND IMAGING: CBC    Component Value Date/Time   WBC 2.8 (L) 01/15/2024 1409   WBC 4.2 01/09/2023 1443   RBC 4.22 01/15/2024 1409   HGB 12.6 (L) 01/15/2024 1409   HCT 39.0 01/15/2024 1409   PLT 155 01/15/2024 1409   MCV 92.4 01/15/2024 1409   MCH 29.9  01/15/2024 1409   MCHC 32.3 01/15/2024 1409   RDW 14.3 01/15/2024 1409   LYMPHSABS 1.2 01/15/2024 1409   MONOABS 0.4 01/15/2024 1409   EOSABS 0.1 01/15/2024 1409   BASOSABS 0.1 01/15/2024 1409    CMP     Component Value Date/Time   NA 141 01/15/2024 1409   K 4.1 01/15/2024 1409   CL 105 01/15/2024 1409   CO2 25 01/15/2024 1409   GLUCOSE 82 01/15/2024 1409   BUN 15 01/15/2024 1409   CREATININE 0.91 01/15/2024 1409   CALCIUM 9.2 01/15/2024 1409   PROT 7.0 01/15/2024 1409   ALBUMIN 4.5 01/15/2024 1409   AST 24 01/15/2024 1409   ALT 16 01/15/2024 1409   ALKPHOS 64 01/15/2024 1409   BILITOT 0.5 01/15/2024 1409   GFRNONAA >60 01/15/2024 1409   GFRAA >60 04/07/2015 1839     Assessment/Plan:   Assessment & Plan Unintentional weight loss with dysgeusia and chronic abdominal pain Chronic anemia Significant weight loss with dysgeusia and chronic abdominal pain. CT scans normal. Hematology/oncology evaluation stable.  - Scheduled upper endoscopy and colonoscopy. I thoroughly discussed the procedure with the patient to include nature of the procedure, alternatives, benefits, and risks (including but not limited to bleeding, infection, perforation, anesthesia/cardiac/pulmonary complications). Patient verbalized understanding and gave verbal consent to proceed with procedure.  - Continue omeprazole . - Consider dietitian referral if endoscopy inconclusive.  Dysphagia Difficulty swallowing, especially meats. Possible esophageal compression from spinal hardware (anterior cervical discectomy and fusion C3-C5 in 2020).  - Perform upper endoscopy.  Gastritis, post-endoscopy Gastritis diagnosed via endoscopy. Managed with omeprazole. Persistent abdominal pain without acid reflux or heartburn.  - Continue omeprazole.   Camie Furbish, PA-C [facility] 01/17/2024, 11:42 AM  Patient Care Team: Roxene Savant, NP as PCP - General (Family Medicine) Pcp, No

## 2024-01-21 ENCOUNTER — Encounter: Payer: Self-pay | Admitting: Gastroenterology

## 2024-01-21 NOTE — Progress Notes (Signed)
 Attending physician's note   I have taken history, reviewed the chart and examined the patient. I performed a substantive portion of this encounter, including complete performance of at least one of the key components, in conjunction with the APP. I agree with the Advanced Practitioner's note, impression and recommendations.    Anselm Bring, MD [facility] [phone]

## 2024-02-14 ENCOUNTER — Encounter: Payer: Self-pay | Admitting: Gastroenterology

## 2024-02-19 ENCOUNTER — Inpatient Hospital Stay: Attending: Hematology & Oncology

## 2024-02-19 ENCOUNTER — Inpatient Hospital Stay: Admitting: Family

## 2024-02-19 ENCOUNTER — Inpatient Hospital Stay

## 2024-02-19 DIAGNOSIS — D539 Nutritional anemia, unspecified: Secondary | ICD-10-CM | POA: Insufficient documentation

## 2024-02-19 DIAGNOSIS — D72819 Decreased white blood cell count, unspecified: Secondary | ICD-10-CM | POA: Insufficient documentation

## 2024-02-20 ENCOUNTER — Telehealth: Payer: Self-pay | Admitting: Gastroenterology

## 2024-02-20 NOTE — Telephone Encounter (Signed)
 Patient and wife called this evening. There is a family emergency in a patient who is on hospice services. He cannot make his E/C for tomorrow. He is not sure about timing of rescheduling, but apologizes for inconvenience. Will forward to Dr. Ira team to reschedule.   Aloha Finner, MD [facility] Advanced Endoscopy Office # [phone]

## 2024-02-21 ENCOUNTER — Encounter: Admitting: Gastroenterology

## 2024-02-21 NOTE — Telephone Encounter (Signed)
 Reminder created in Epic to follow up in 1 week for rescheduling.

## 2024-02-27 ENCOUNTER — Other Ambulatory Visit: Payer: Self-pay

## 2024-02-27 DIAGNOSIS — Z8719 Personal history of other diseases of the digestive system: Secondary | ICD-10-CM

## 2024-03-04 ENCOUNTER — Inpatient Hospital Stay

## 2024-03-04 ENCOUNTER — Ambulatory Visit: Admission: RE | Admit: 2024-03-04 | Discharge: 2024-03-04 | Disposition: A | Source: Ambulatory Visit

## 2024-03-04 ENCOUNTER — Encounter: Payer: Self-pay | Admitting: Family

## 2024-03-04 ENCOUNTER — Other Ambulatory Visit: Payer: Self-pay

## 2024-03-04 ENCOUNTER — Inpatient Hospital Stay: Admitting: Family

## 2024-03-04 VITALS — BP 136/84 | HR 72 | Temp 97.5°F | Resp 17 | Wt 117.4 lb

## 2024-03-04 DIAGNOSIS — D631 Anemia in chronic kidney disease: Secondary | ICD-10-CM | POA: Diagnosis not present

## 2024-03-04 DIAGNOSIS — G8929 Other chronic pain: Secondary | ICD-10-CM

## 2024-03-04 DIAGNOSIS — D72819 Decreased white blood cell count, unspecified: Secondary | ICD-10-CM

## 2024-03-04 DIAGNOSIS — D539 Nutritional anemia, unspecified: Secondary | ICD-10-CM | POA: Diagnosis present

## 2024-03-04 LAB — CBC WITH DIFFERENTIAL (CANCER CENTER ONLY)
Abs Immature Granulocytes: 0.01 K/uL (ref 0.00–0.07)
Basophils Absolute: 0 K/uL (ref 0.0–0.1)
Basophils Relative: 1 %
Eosinophils Absolute: 0.2 K/uL (ref 0.0–0.5)
Eosinophils Relative: 5 %
HCT: 35.6 % — ABNORMAL LOW (ref 39.0–52.0)
Hemoglobin: 12 g/dL — ABNORMAL LOW (ref 13.0–17.0)
Immature Granulocytes: 0 %
Lymphocytes Relative: 41 %
Lymphs Abs: 1.9 K/uL (ref 0.7–4.0)
MCH: 29.6 pg (ref 26.0–34.0)
MCHC: 33.7 g/dL (ref 30.0–36.0)
MCV: 87.9 fL (ref 80.0–100.0)
Monocytes Absolute: 0.5 K/uL (ref 0.1–1.0)
Monocytes Relative: 10 %
Neutro Abs: 2.1 K/uL (ref 1.7–7.7)
Neutrophils Relative %: 43 %
Platelet Count: 182 K/uL (ref 150–400)
RBC: 4.05 MIL/uL — ABNORMAL LOW (ref 4.22–5.81)
RDW: 12.6 % (ref 11.5–15.5)
WBC Count: 4.7 K/uL (ref 4.0–10.5)
nRBC: 0 % (ref 0.0–0.2)

## 2024-03-04 LAB — CMP (CANCER CENTER ONLY)
ALT: 19 U/L (ref 0–44)
AST: 27 U/L (ref 15–41)
Albumin: 4.2 g/dL (ref 3.5–5.0)
Alkaline Phosphatase: 77 U/L (ref 38–126)
Anion gap: 8 (ref 5–15)
BUN: 15 mg/dL (ref 8–23)
CO2: 27 mmol/L (ref 22–32)
Calcium: 9 mg/dL (ref 8.9–10.3)
Chloride: 104 mmol/L (ref 98–111)
Creatinine: 1.05 mg/dL (ref 0.61–1.24)
GFR, Estimated: >60 mL/min
Glucose, Bld: 86 mg/dL (ref 70–99)
Potassium: 4.2 mmol/L (ref 3.5–5.1)
Sodium: 140 mmol/L (ref 135–145)
Total Bilirubin: 0.3 mg/dL (ref 0.0–1.2)
Total Protein: 6.7 g/dL (ref 6.5–8.1)

## 2024-03-04 LAB — LACTATE DEHYDROGENASE: LDH: 197 U/L (ref 105–235)

## 2024-03-04 LAB — SAVE SMEAR(SSMR), FOR PROVIDER SLIDE REVIEW

## 2024-03-04 NOTE — Telephone Encounter (Signed)
"  Left message for pt to call back   "

## 2024-03-04 NOTE — Progress Notes (Signed)
 " Hematology and Oncology Follow Up Visit  Carlos Ross [id number] Sep 03, 1955 68 y.o. 03/04/2024   Principle Diagnosis:  Erythropoietin  deficiency anemia    Current Therapy:        Aranesp  300 mcg SQ PRN for Hgb < 11  Interim History:  Carlos Ross is here today for follow-up. He is doing well and notes that his energy is better.  He has had some arthritic pain in the left shoulder off and on.  No injury reported. No falls or syncope. He has occasional cramping in the abdomen that is brief. He missed his colonoscopy last week due to a family emergency and will get this rescheduled in the new year.  No fever, chills, n/v, cough, rash, dizziness, SOB, chest pain, palpitations or changes in bowel or bladder habits.  No swelling, numbness or tingling in his extremities.  Appetite and hydration are good. Weight is stable at 111 lbs.   ECOG Performance Status: 1 - Symptomatic but completely ambulatory  Medications:  Allergies as of 03/04/2024       Reactions   Naproxen Hives, Shortness Of Breath   Sesame Oil Hives, Itching   Soy Allergy (obsolete) Hives, Shortness Of Breath, Dermatitis   Nexium [esomeprazole Magnesium] Hives   Nexium [esomeprazole Magnesium] Hives   Cefaclor Hives   Wheat Itching        Medication List        Accurate as of March 04, 2024  2:31 PM. If you have any questions, ask your nurse or doctor.          albuterol  108 (90 Base) MCG/ACT inhaler Commonly known as: VENTOLIN  HFA Inhale 1-2 puffs into the lungs every 6 (six) hours as needed for wheezing or shortness of breath.   amitriptyline 10 MG tablet Commonly known as: ELAVIL Take 10 mg by mouth at bedtime.   atorvastatin 20 MG tablet Commonly known as: LIPITOR Take 20 mg by mouth daily.   Clobetasol Prop Emollient Base 0.05 % emollient cream Apply 1 Application topically 2 (two) times daily.   diphenhydrAMINE  25 MG tablet Commonly known as: Benadryl  Take 2 tablets (50 mg  total) by mouth every 4 (four) hours as needed for itching.   EPINEPHrine  0.3 mg/0.3 mL Soaj injection Commonly known as: EpiPen  2-Pak Inject 0.3 mLs (0.3 mg total) into the muscle once as needed (for severe allergic reaction). CAll 911 immediately if you have to use this medicine   hydrochlorothiazide 25 MG tablet Commonly known as: HYDRODIURIL Take 25 mg by mouth daily.   lisinopril 10 MG tablet Commonly known as: ZESTRIL Take 10 mg by mouth daily.   Symbicort 80-4.5 MCG/ACT inhaler Generic drug: budesonide-formoterol Inhale 2 puffs into the lungs every 12 (twelve) hours.        Allergies: Allergies[1]  Past Medical History, Surgical history, Social history, and Family History were reviewed and updated.  Review of Systems: All other 10 point review of systems is negative.   Physical Exam:  vitals were not taken for this visit.   Wt Readings from Last 3 Encounters:  01/17/24 111 lb 8 oz (50.6 kg)  01/15/24 113 lb (51.3 kg)  12/13/23 107 lb 12.8 oz (48.9 kg)    Ocular: Sclerae unicteric, pupils equal, round and reactive to light Ear-nose-throat: Oropharynx clear, dentition fair Lymphatic: No cervical or supraclavicular adenopathy Lungs no rales or rhonchi, good excursion bilaterally Heart regular rate and rhythm, no murmur appreciated Abd soft, nontender, positive bowel sounds MSK no focal spinal tenderness, no joint  edema Neuro: non-focal, well-oriented, appropriate affect Breasts: Deferred  Lab Results  Component Value Date   WBC 4.7 03/04/2024   HGB 12.0 (L) 03/04/2024   HCT 35.6 (L) 03/04/2024   MCV 87.9 03/04/2024   PLT 182 03/04/2024   Lab Results  Component Value Date   FERRITIN 276 12/13/2023   IRON 79 12/13/2023   TIBC 309 12/13/2023   UIBC 230 12/13/2023   IRONPCTSAT 26 12/13/2023   Lab Results  Component Value Date   RETICCTPCT 0.6 12/13/2023   RBC 4.05 (L) 03/04/2024   No results found for: KPAFRELGTCHN, LAMBDASER,  KAPLAMBRATIO No results found for: IGGSERUM, IGA, IGMSERUM No results found for: STEPHANY CARLOTA BENSON MARKEL EARLA JOANNIE DOC VICK, SPEI   Chemistry      Component Value Date/Time   NA 141 01/15/2024 1409   K 4.1 01/15/2024 1409   CL 105 01/15/2024 1409   CO2 25 01/15/2024 1409   BUN 15 01/15/2024 1409   CREATININE 0.91 01/15/2024 1409      Component Value Date/Time   CALCIUM 9.2 01/15/2024 1409   ALKPHOS 64 01/15/2024 1409   AST 24 01/15/2024 1409   ALT 16 01/15/2024 1409   BILITOT 0.5 01/15/2024 1409       Impression and Plan: Carlos Ross is a very pleasant 68 yo African American gentleman with erythropoietin  deficiency anemia.  No ESA needed this visit. Hgb is 12.0.  Leukopenia resolved at this visit.  Follow-up in 8 weeks.    Lauraine Pepper, NP 12/29/20252:31 PM     [1]  Allergies Allergen Reactions   Naproxen Hives and Shortness Of Breath   Sesame Oil Hives and Itching   Soy Allergy (Obsolete) Hives, Shortness Of Breath and Dermatitis   Nexium [Esomeprazole Magnesium] Hives   Nexium [Esomeprazole Magnesium] Hives   Cefaclor Hives   Wheat Itching   "

## 2024-03-06 ENCOUNTER — Ambulatory Visit
Admission: RE | Admit: 2024-03-06 | Discharge: 2024-03-06 | Disposition: A | Discharge status: Home / Self Care | Source: Ambulatory Visit

## 2024-03-06 DIAGNOSIS — Z8719 Personal history of other diseases of the digestive system: Secondary | ICD-10-CM

## 2024-03-06 MED ORDER — IOPAMIDOL (ISOVUE-370) INJECTION 76%
100.0000 mL | Freq: Once | INTRAVENOUS | Status: AC | PRN
  Administered 2024-03-06: 100 mL via INTRAVENOUS

## 2024-03-08 ENCOUNTER — Telehealth: Payer: Self-pay | Admitting: Gastroenterology

## 2024-03-08 DIAGNOSIS — R1319 Other dysphagia: Secondary | ICD-10-CM

## 2024-03-08 DIAGNOSIS — R1013 Epigastric pain: Secondary | ICD-10-CM

## 2024-03-08 DIAGNOSIS — D509 Iron deficiency anemia, unspecified: Secondary | ICD-10-CM

## 2024-03-08 DIAGNOSIS — R634 Abnormal weight loss: Secondary | ICD-10-CM

## 2024-03-08 NOTE — Telephone Encounter (Signed)
 New instructions mailed per pt request. Pt informed

## 2024-03-08 NOTE — Telephone Encounter (Signed)
 Pts wife rescheduled ENDO/COL for 05/07/2024.  Pt in need of updated prep instructions. Wife requesting they be mailed to residence. Please advise thank you.

## 2024-03-12 NOTE — Telephone Encounter (Signed)
 Chart reviewed and noted the following documentation.    All Conversations: Advice Only (Newest Message First)            Alwin Corean NOVAK, [place]    03/08/24  3:37 PM Note New instructions mailed per pt request. Pt informed       03/08/24  3:31 PM Alwin Corean NOVAK, CMA routed this conversation to Arletta Camie FORBES DEVONNA    03/08/24 12:39 PM Tanda Holmes, NT routed this conversation to Alwin Corean NOVAK, CMA  Tanda Holmes, [place]    03/08/24 12:39 PM Note Pts wife rescheduled ENDO/COL for 05/07/2024.  Pt in need of updated prep instructions. Wife requesting they be mailed to residence. Please advise thank you.

## 2024-04-03 ENCOUNTER — Encounter: Payer: Self-pay | Admitting: Family

## 2024-04-30 ENCOUNTER — Inpatient Hospital Stay

## 2024-04-30 ENCOUNTER — Inpatient Hospital Stay: Admitting: Family

## 2024-05-07 ENCOUNTER — Encounter: Admitting: Gastroenterology
